# Patient Record
Sex: Female | Born: 1980 | Race: White | Hispanic: No | Marital: Single | Smoking: Current some day smoker
Health system: Southern US, Community
[De-identification: ages and names within clinical notes are randomized; demographics above are authoritative.]

## PROBLEM LIST (undated history)

## (undated) HISTORY — PX: TUBAL LIGATION: SHX77

---

## 2022-01-15 ENCOUNTER — Other Ambulatory Visit: Payer: Self-pay

## 2022-01-15 ENCOUNTER — Observation Stay: Payer: Self-pay

## 2022-01-15 ENCOUNTER — Observation Stay: Payer: Self-pay | Admitting: Anesthesiology

## 2022-01-15 ENCOUNTER — Encounter: Payer: Self-pay | Admitting: Orthopedic Surgery

## 2022-01-15 ENCOUNTER — Observation Stay
Admission: EM | Admit: 2022-01-15 | Discharge: 2022-01-16 | Disposition: A | Discharge status: Home / Self Care | Payer: Self-pay | Attending: Orthopedic Surgery | Admitting: Orthopedic Surgery

## 2022-01-15 ENCOUNTER — Emergency Department: Payer: Self-pay

## 2022-01-15 ENCOUNTER — Encounter
Admission: EM | Disposition: A | Discharge status: Home / Self Care | Payer: Self-pay | Source: Home / Self Care | Attending: Emergency Medicine

## 2022-01-15 DIAGNOSIS — S8261XA Displaced fracture of lateral malleolus of right fibula, initial encounter for closed fracture: Principal | ICD-10-CM | POA: Insufficient documentation

## 2022-01-15 DIAGNOSIS — Z20822 Contact with and (suspected) exposure to covid-19: Secondary | ICD-10-CM | POA: Insufficient documentation

## 2022-01-15 DIAGNOSIS — S8251XA Displaced fracture of medial malleolus of right tibia, initial encounter for closed fracture: Secondary | ICD-10-CM | POA: Insufficient documentation

## 2022-01-15 DIAGNOSIS — X501XXA Overexertion from prolonged static or awkward postures, initial encounter: Secondary | ICD-10-CM | POA: Insufficient documentation

## 2022-01-15 DIAGNOSIS — T1490XA Injury, unspecified, initial encounter: Secondary | ICD-10-CM

## 2022-01-15 DIAGNOSIS — Z419 Encounter for procedure for purposes other than remedying health state, unspecified: Secondary | ICD-10-CM

## 2022-01-15 DIAGNOSIS — S82891A Other fracture of right lower leg, initial encounter for closed fracture: Secondary | ICD-10-CM | POA: Diagnosis present

## 2022-01-15 DIAGNOSIS — S82841A Displaced bimalleolar fracture of right lower leg, initial encounter for closed fracture: Secondary | ICD-10-CM

## 2022-01-15 LAB — SURGICAL PCR SCREEN
MRSA, PCR: NEGATIVE
Staphylococcus aureus: NEGATIVE

## 2022-01-15 LAB — RESP PANEL BY RT-PCR (FLU A&B, COVID) ARPGX2
Influenza A by PCR: NEGATIVE
Influenza B by PCR: NEGATIVE
SARS Coronavirus 2 by RT PCR: NEGATIVE

## 2022-01-15 LAB — ETHANOL: Alcohol, Ethyl (B): 170 mg/dL — ABNORMAL HIGH (ref <10)

## 2022-01-15 LAB — HIV ANTIBODY (ROUTINE TESTING W REFLEX): HIV Screen 4th Generation wRfx: NONREACTIVE

## 2022-01-15 SURGERY — OPEN REDUCTION INTERNAL FIXATION (ORIF) ANKLE FRACTURE
Anesthesia: Regional | Site: Ankle | Laterality: Right

## 2022-01-15 MED ORDER — KETOROLAC TROMETHAMINE 15 MG/ML IJ SOLN: 15.0000 mg | Freq: Four times a day (QID) | INTRAMUSCULAR | Status: DC

## 2022-01-15 MED ORDER — BISACODYL 10 MG RE SUPP: 10.0000 mg | Freq: Every day | RECTAL | Status: DC | PRN

## 2022-01-15 MED ORDER — MIDAZOLAM HCL 2 MG/2ML IJ SOLN: 2.0000 mg | Freq: Once | INTRAMUSCULAR | Status: AC

## 2022-01-15 MED ORDER — OXYCODONE HCL 5 MG/5ML PO SOLN: 5.0000 mg | Freq: Once | ORAL | Status: DC | PRN

## 2022-01-15 MED ORDER — MORPHINE SULFATE (PF) 4 MG/ML IV SOLN
4.0000 mg | Freq: Once | INTRAVENOUS | Status: AC
  Administered 2022-01-15: 4 mg via INTRAVENOUS
  Filled 2022-01-15: qty 1

## 2022-01-15 MED ORDER — FENTANYL CITRATE (PF) 100 MCG/2ML IJ SOLN
INTRAMUSCULAR | Status: DC | PRN
  Administered 2022-01-15 (×2): 50 ug via INTRAVENOUS

## 2022-01-15 MED ORDER — ACETAMINOPHEN 10 MG/ML IV SOLN
INTRAVENOUS | Status: AC
  Filled 2022-01-15: qty 100

## 2022-01-15 MED ORDER — METOCLOPRAMIDE HCL 5 MG/ML IJ SOLN: 5.0000 mg | Freq: Three times a day (TID) | INTRAMUSCULAR | Status: DC | PRN

## 2022-01-15 MED ORDER — DEXMEDETOMIDINE (PRECEDEX) IN NS 20 MCG/5ML (4 MCG/ML) IV SYRINGE
PREFILLED_SYRINGE | INTRAVENOUS | Status: DC | PRN
  Administered 2022-01-15: 12 ug via INTRAVENOUS

## 2022-01-15 MED ORDER — ONDANSETRON HCL 4 MG PO TABS: 4.0000 mg | ORAL_TABLET | Freq: Four times a day (QID) | ORAL | Status: DC | PRN

## 2022-01-15 MED ORDER — BUPIVACAINE HCL (PF) 0.5 % IJ SOLN
INTRAMUSCULAR | Status: DC | PRN
  Administered 2022-01-15: 30 mL

## 2022-01-15 MED ORDER — HYDROMORPHONE HCL 1 MG/ML IJ SOLN: 0.5000 mg | INTRAMUSCULAR | Status: DC | PRN

## 2022-01-15 MED ORDER — LACTATED RINGERS IV SOLN: INTRAVENOUS | Status: DC

## 2022-01-15 MED ORDER — DEXAMETHASONE SODIUM PHOSPHATE 10 MG/ML IJ SOLN
INTRAMUSCULAR | Status: DC | PRN
Start: 2022-01-15 — End: 2022-01-15
  Administered 2022-01-15: 8 mg via INTRAVENOUS

## 2022-01-15 MED ORDER — CHLORHEXIDINE GLUCONATE 0.12 % MT SOLN
OROMUCOSAL | Status: AC
  Filled 2022-01-15: qty 15

## 2022-01-15 MED ORDER — CEFAZOLIN SODIUM-DEXTROSE 2-4 GM/100ML-% IV SOLN
INTRAVENOUS | Status: AC
  Filled 2022-01-15: qty 100

## 2022-01-15 MED ORDER — BUPIVACAINE HCL (PF) 0.5 % IJ SOLN
INTRAMUSCULAR | Status: AC
  Filled 2022-01-15: qty 30

## 2022-01-15 MED ORDER — MIDAZOLAM HCL 2 MG/2ML IJ SOLN
INTRAMUSCULAR | Status: AC
Start: 2022-01-15 — End: 2022-01-15
  Administered 2022-01-15: 2 mg via INTRAVENOUS
  Filled 2022-01-15: qty 2

## 2022-01-15 MED ORDER — OXYCODONE HCL 5 MG PO TABS
10.0000 mg | ORAL_TABLET | ORAL | Status: DC | PRN
  Administered 2022-01-16 (×2): 15 mg via ORAL

## 2022-01-15 MED ORDER — LACTATED RINGERS IV SOLN: INTRAVENOUS | Status: DC | PRN

## 2022-01-15 MED ORDER — ONDANSETRON HCL 4 MG/2ML IJ SOLN
4.0000 mg | Freq: Once | INTRAMUSCULAR | Status: AC
  Administered 2022-01-15: 4 mg via INTRAVENOUS
  Filled 2022-01-15: qty 2

## 2022-01-15 MED ORDER — MIDAZOLAM HCL 2 MG/2ML IJ SOLN
INTRAMUSCULAR | Status: AC
  Filled 2022-01-15: qty 2

## 2022-01-15 MED ORDER — FENTANYL CITRATE (PF) 100 MCG/2ML IJ SOLN: 25.0000 ug | INTRAMUSCULAR | Status: DC | PRN

## 2022-01-15 MED ORDER — ONDANSETRON HCL 4 MG/2ML IJ SOLN
INTRAMUSCULAR | Status: DC | PRN
  Administered 2022-01-15: 4 mg via INTRAVENOUS

## 2022-01-15 MED ORDER — PRONTOSAN WOUND IRRIGATION OPTIME
TOPICAL | Status: DC | PRN
  Administered 2022-01-15: 1

## 2022-01-15 MED ORDER — OXYCODONE HCL 5 MG PO TABS: 5.0000 mg | ORAL_TABLET | ORAL | Status: DC | PRN

## 2022-01-15 MED ORDER — PROPOFOL 10 MG/ML IV BOLUS
INTRAVENOUS | Status: DC | PRN
  Administered 2022-01-15: 200 mg via INTRAVENOUS

## 2022-01-15 MED ORDER — ACETAMINOPHEN 10 MG/ML IV SOLN: 1000.0000 mg | Freq: Once | INTRAVENOUS | Status: DC | PRN

## 2022-01-15 MED ORDER — METHOCARBAMOL 500 MG PO TABS
500.0000 mg | ORAL_TABLET | Freq: Four times a day (QID) | ORAL | Status: DC | PRN
  Administered 2022-01-16: 500 mg via ORAL
  Filled 2022-01-15: qty 1

## 2022-01-15 MED ORDER — DIPHENHYDRAMINE HCL 12.5 MG/5ML PO ELIX: 12.5000 mg | ORAL_SOLUTION | ORAL | Status: DC | PRN

## 2022-01-15 MED ORDER — ACETAMINOPHEN 325 MG PO TABS: 325.0000 mg | ORAL_TABLET | Freq: Four times a day (QID) | ORAL | Status: DC | PRN

## 2022-01-15 MED ORDER — ONDANSETRON HCL 4 MG/2ML IJ SOLN: 4.0000 mg | Freq: Four times a day (QID) | INTRAMUSCULAR | Status: DC | PRN

## 2022-01-15 MED ORDER — FENTANYL CITRATE (PF) 100 MCG/2ML IJ SOLN
INTRAMUSCULAR | Status: AC
  Filled 2022-01-15: qty 2

## 2022-01-15 MED ORDER — OXYCODONE HCL 5 MG PO TABS: 5.0000 mg | ORAL_TABLET | Freq: Once | ORAL | Status: DC | PRN

## 2022-01-15 MED ORDER — ZOLPIDEM TARTRATE 5 MG PO TABS
5.0000 mg | ORAL_TABLET | Freq: Every evening | ORAL | Status: DC | PRN
Start: 2022-01-15 — End: 2022-01-16

## 2022-01-15 MED ORDER — EPHEDRINE SULFATE (PRESSORS) 50 MG/ML IJ SOLN
INTRAMUSCULAR | Status: DC | PRN
  Administered 2022-01-15: 10 mg via INTRAVENOUS

## 2022-01-15 MED ORDER — MIDAZOLAM HCL 2 MG/2ML IJ SOLN
INTRAMUSCULAR | Status: DC | PRN
  Administered 2022-01-15: 2 mg via INTRAVENOUS

## 2022-01-15 MED ORDER — FENTANYL CITRATE PF 50 MCG/ML IJ SOSY: 100.0000 ug | PREFILLED_SYRINGE | Freq: Once | INTRAMUSCULAR | Status: AC

## 2022-01-15 MED ORDER — BUPIVACAINE-EPINEPHRINE (PF) 0.25% -1:200000 IJ SOLN
INTRAMUSCULAR | Status: AC
  Filled 2022-01-15: qty 30

## 2022-01-15 MED ORDER — CEFAZOLIN SODIUM-DEXTROSE 2-4 GM/100ML-% IV SOLN
2.0000 g | INTRAVENOUS | Status: AC
  Administered 2022-01-15: 2 g via INTRAVENOUS
  Filled 2022-01-15: qty 100

## 2022-01-15 MED ORDER — GLYCOPYRROLATE 0.2 MG/ML IJ SOLN
INTRAMUSCULAR | Status: DC | PRN
  Administered 2022-01-15: .2 mg via INTRAVENOUS

## 2022-01-15 MED ORDER — MUPIROCIN 2 % EX OINT
1.0000 "application " | TOPICAL_OINTMENT | Freq: Two times a day (BID) | CUTANEOUS | Status: DC
  Filled 2022-01-15: qty 22

## 2022-01-15 MED ORDER — FENTANYL CITRATE PF 50 MCG/ML IJ SOSY
PREFILLED_SYRINGE | INTRAMUSCULAR | Status: AC
  Administered 2022-01-15: 50 ug via INTRAVENOUS
  Filled 2022-01-15: qty 2

## 2022-01-15 MED ORDER — DOCUSATE SODIUM 100 MG PO CAPS: 100.0000 mg | ORAL_CAPSULE | Freq: Two times a day (BID) | ORAL | Status: DC

## 2022-01-15 MED ORDER — METHOCARBAMOL 1000 MG/10ML IJ SOLN
500.0000 mg | Freq: Four times a day (QID) | INTRAVENOUS | Status: DC | PRN
  Filled 2022-01-15: qty 5

## 2022-01-15 MED ORDER — MAGNESIUM HYDROXIDE 400 MG/5ML PO SUSP
30.0000 mL | Freq: Every day | ORAL | Status: DC | PRN
  Administered 2022-01-16: 30 mL via ORAL
  Filled 2022-01-15: qty 30

## 2022-01-15 MED ORDER — ACETAMINOPHEN 10 MG/ML IV SOLN
INTRAVENOUS | Status: DC | PRN
  Administered 2022-01-15: 1000 mg via INTRAVENOUS

## 2022-01-15 MED ORDER — DOCUSATE SODIUM 100 MG PO CAPS
100.0000 mg | ORAL_CAPSULE | Freq: Two times a day (BID) | ORAL | Status: DC
  Administered 2022-01-15 (×2): 100 mg via ORAL
  Filled 2022-01-15 (×2): qty 1

## 2022-01-15 MED ORDER — FENTANYL CITRATE PF 50 MCG/ML IJ SOSY
50.0000 ug | PREFILLED_SYRINGE | INTRAMUSCULAR | Status: DC | PRN
  Administered 2022-01-15: 50 ug via INTRAVENOUS
  Filled 2022-01-15: qty 1

## 2022-01-15 MED ORDER — OXYCODONE HCL 5 MG PO TABS
10.0000 mg | ORAL_TABLET | ORAL | Status: DC | PRN
  Administered 2022-01-15: 15 mg via ORAL
  Filled 2022-01-15 (×3): qty 3

## 2022-01-15 MED ORDER — ONDANSETRON HCL 4 MG/2ML IJ SOLN: 4.0000 mg | Freq: Once | INTRAMUSCULAR | Status: DC | PRN

## 2022-01-15 MED ORDER — LIDOCAINE HCL (CARDIAC) PF 100 MG/5ML IV SOSY
PREFILLED_SYRINGE | INTRAVENOUS | Status: DC | PRN
  Administered 2022-01-15: 100 mg via INTRAVENOUS

## 2022-01-15 MED ORDER — CEFAZOLIN SODIUM-DEXTROSE 2-4 GM/100ML-% IV SOLN
2.0000 g | Freq: Four times a day (QID) | INTRAVENOUS | Status: AC
  Administered 2022-01-15 – 2022-01-16 (×3): 2 g via INTRAVENOUS
  Filled 2022-01-15 (×3): qty 100

## 2022-01-15 MED ORDER — METOCLOPRAMIDE HCL 10 MG PO TABS: 5.0000 mg | ORAL_TABLET | Freq: Three times a day (TID) | ORAL | Status: DC | PRN

## 2022-01-15 MED ORDER — KETOROLAC TROMETHAMINE 30 MG/ML IJ SOLN
15.0000 mg | Freq: Four times a day (QID) | INTRAMUSCULAR | Status: AC
  Administered 2022-01-15 – 2022-01-16 (×4): 15 mg via INTRAVENOUS
  Filled 2022-01-15 (×4): qty 1

## 2022-01-15 SURGICAL SUPPLY — 60 items
BIT DRILL 2.5 X LONG (BIT) ×1
BIT DRILL CANN 2.7X625 NONSTRL (BIT) ×1 IMPLANT
BIT DRILL LCP QC 2X140 (BIT) ×1 IMPLANT
BIT DRILL QC 2.5X135 (BIT) ×1 IMPLANT
BIT DRILL QC 3.5 150 (BIT) ×1 IMPLANT
BIT DRILL X LONG 2.5 (BIT) IMPLANT
BNDG COHESIVE 6X5 TAN ST LF (GAUZE/BANDAGES/DRESSINGS) ×2 IMPLANT
BNDG ELASTIC 6X5.8 VLCR STR LF (GAUZE/BANDAGES/DRESSINGS) ×2 IMPLANT
BNDG ESMARK 6X12 TAN STRL LF (GAUZE/BANDAGES/DRESSINGS) ×2 IMPLANT
BRUSH SCRUB EZ  4% CHG (MISCELLANEOUS) ×2
BRUSH SCRUB EZ 4% CHG (MISCELLANEOUS) ×2 IMPLANT
CHLORAPREP W/TINT 26 (MISCELLANEOUS) ×4 IMPLANT
CUFF TOURN SGL QUICK 24 (TOURNIQUET CUFF)
CUFF TOURN SGL QUICK 34 (TOURNIQUET CUFF)
CUFF TRNQT CYL 24X4X16.5-23 (TOURNIQUET CUFF) IMPLANT
CUFF TRNQT CYL 34X4.125X (TOURNIQUET CUFF) IMPLANT
DRAPE 3/4 80X56 (DRAPES) ×2 IMPLANT
DRAPE FLUOR MINI C-ARM 54X84 (DRAPES) ×2 IMPLANT
DRILL BIT X LONG 2.5 (BIT) ×1
ELECT REM PT RETURN 9FT ADLT (ELECTROSURGICAL) ×2
ELECTRODE REM PT RTRN 9FT ADLT (ELECTROSURGICAL) ×1 IMPLANT
GAUZE XEROFORM 1X8 LF (GAUZE/BANDAGES/DRESSINGS) ×4 IMPLANT
GLOVE SURG ORTHO LTX SZ8.5 (GLOVE) ×6 IMPLANT
GLOVE SURG UNDER POLY LF SZ8.5 (GLOVE) ×6 IMPLANT
GOWN STRL REUS W/ TWL LRG LVL3 (GOWN DISPOSABLE) ×1 IMPLANT
GOWN STRL REUS W/ TWL XL LVL3 (GOWN DISPOSABLE) ×1 IMPLANT
GOWN STRL REUS W/TWL LRG LVL3 (GOWN DISPOSABLE) ×1
GOWN STRL REUS W/TWL XL LVL3 (GOWN DISPOSABLE) ×1
GUIDEWARE NON THREAD 1.25X150 (WIRE) ×4
GUIDEWIRE NON THREAD 1.25X150 (WIRE) IMPLANT
KIT TURNOVER KIT A (KITS) ×2 IMPLANT
MANIFOLD NEPTUNE II (INSTRUMENTS) ×2 IMPLANT
NS IRRIG 1000ML POUR BTL (IV SOLUTION) ×2 IMPLANT
PACK EXTREMITY ARMC (MISCELLANEOUS) ×2 IMPLANT
PAD ABD DERMACEA PRESS 5X9 (GAUZE/BANDAGES/DRESSINGS) ×8 IMPLANT
PAD CAST CTTN 4X4 STRL (SOFTGOODS) ×2 IMPLANT
PADDING CAST COTTON 4X4 STRL (SOFTGOODS) ×2
PLATE FIBULA 5 HOLE RIGHT (Plate) ×1 IMPLANT
SCALPEL PROTECTED #10 DISP (BLADE) ×2 IMPLANT
SCALPEL PROTECTED #15 DISP (BLADE) ×2 IMPLANT
SCREW CANN L THRD/46 4.0 (Screw) ×2 IMPLANT
SCREW CORT ST T8 SD REC 2.7X20 (Screw) IMPLANT
SCREW CORTEX LOW PRO 3.5X28 (Screw) IMPLANT
SCREW CORTEX PROFILE 3.5X40 (Screw) ×1 IMPLANT
SCREW CORTICAL 2.7X24MM (Screw) ×1 IMPLANT
SCREW LOCK VA ST 2.7X18 (Screw) ×3 IMPLANT
SCREW LOCK VA ST 2.7X20 (Screw) ×2 IMPLANT
SCREW SELF TAP 12M (Screw) ×1 IMPLANT
SCREW SELF TAP 14MM (Screw) ×2 IMPLANT
SOLUTION PRONTOSAN WOUND 350ML (IRRIGATION / IRRIGATOR) ×1 IMPLANT
SPLINT CAST 1 STEP 5X30 WHT (MISCELLANEOUS) ×2 IMPLANT
SPONGE T-LAP 18X18 ~~LOC~~+RFID (SPONGE) ×2 IMPLANT
STAPLER SKIN PROX 35W (STAPLE) ×3 IMPLANT
SUT VIC AB 2-0 CT2 27 (SUTURE) ×4 IMPLANT
SUT VIC AB 3-0 SH 27 (SUTURE) ×1
SUT VIC AB 3-0 SH 27X BRD (SUTURE) ×1 IMPLANT
TAPE SURG TRANSPORE 1 IN (GAUZE/BANDAGES/DRESSINGS) ×1 IMPLANT
TAPE SURGICAL TRANSPORE 1 IN (GAUZE/BANDAGES/DRESSINGS) ×1
TOWEL OR 17X26 4PK STRL BLUE (TOWEL DISPOSABLE) ×4 IMPLANT
WATER STERILE IRR 500ML POUR (IV SOLUTION) ×2 IMPLANT

## 2022-01-15 NOTE — Anesthesia Procedure Notes (Signed)
Procedure Name: LMA Insertion ?Date/Time: 01/15/2022 4:33 PM ?Performed by: Jaye Beagle, CRNA ?Pre-anesthesia Checklist: Patient identified, Emergency Drugs available, Suction available and Patient being monitored ?Patient Re-evaluated:Patient Re-evaluated prior to induction ?Oxygen Delivery Method: Circle system utilized ?Preoxygenation: Pre-oxygenation with 100% oxygen ?Induction Type: IV induction ?Ventilation: Mask ventilation without difficulty ?LMA: LMA inserted ?LMA Size: 4.0 ?Tube type: Oral ?Number of attempts: 1 ?Airway Equipment and Method: Stylet and Oral airway ?Placement Confirmation: positive ETCO2 and breath sounds checked- equal and bilateral ?Tube secured with: Tape ?Dental Injury: Teeth and Oropharynx as per pre-operative assessment  ? ? ? ? ?

## 2022-01-15 NOTE — Anesthesia Procedure Notes (Signed)
Anesthesia Regional Block: Popliteal block  ? ?Pre-Anesthetic Checklist: , timeout performed,  Correct Patient, Correct Site, Correct Laterality,  Correct Procedure,, risks and benefits discussed,  Surgical consent,  Pre-op evaluation,  At surgeon's request and post-op pain management ? ?Laterality: Right ? ?Prep: chloraprep     ?  ?Needles:  ?Injection technique: Single-shot ? ?Needle Type: Echogenic Needle   ? ? ? ? ? ? ? ?Additional Needles: ? ? ?Procedures:,,,, ultrasound used (permanent image in chart),,   ?Motor weakness within 20 minutes.  ?Narrative:  ?Start time: 01/15/2022 3:19 PM ?End time: 01/15/2022 3:25 PM ?Injection made incrementally with aspirations every 5 mL. ? ?Performed by: Personally  ?Anesthesiologist: Reed Breech, MD ? ?Additional Notes: ?Functioning IV was confirmed and monitors applied.  Sterile prep and drape, hand hygiene and sterile gloves were used. Ultrasound guidance: relevant anatomy identified, needle position confirmed, local anesthetic spread visualized around nerve(s), vascular puncture avoided.  Image saved to electronic medical record.  Negative aspiration prior to incremental administration of local anesthetic for total 20 ml bupivacaine 0.5% given in popliteal sciatic distribution. Difficult visualization of needle at depth of nerve.  The patient tolerated the procedure well. Vital signs and moderate sedation medications recorded in RN notes.  ? ? ? ?

## 2022-01-15 NOTE — ED Notes (Signed)
Ortho/splint supplies in room.  ?

## 2022-01-15 NOTE — ED Provider Notes (Signed)
? ?[facility] ?Provider Note ? ? ? Event Date/Time  ? First MD Initiated Contact with Patient 01/15/22 [phone]   ?  (approximate) ? ? ?History  ? ?Ankle Injury ? ? ?HPI ? ?Joyce Conley is a 41 y.o. female who presents for evaluation of right ankle pain.  Patient reports that she was cleaning her house and she twisted her ankle.  Has been unable to bear any weight.  This happened 2 hours prior to arrival.  Patient denies any other injuries.  She is complaining of constant throbbing sharp pain that is worse with weightbearing.  No knee pain or hip pain.  No head trauma or LOC.  She does not take any blood thinners.  Patient has had prior trimalleolar fracture of this ankle which was repaired surgically in 2011. ?  ? ?PMH ?None  ? ?Physical Exam  ? ?Triage Vital Signs: ?ED Triage Vitals [01/15/22 0307]  ?Enc Vitals Group  ?   BP 107/79  ?   Pulse Rate 89  ?   Resp 16  ?   Temp 97.9 ?F (36.6 ?C)  ?   Temp Source Oral  ?   SpO2 97 %  ?   Weight 240 lb (108.9 kg)  ?   Height 5\' 8"  (1.727 m)  ?   Head Circumference   ?   Peak Flow   ?   Pain Score 8  ?   Pain Loc   ?   Pain Edu?   ?   Excl. in [place]?   ? ? ?Most recent vital signs: ?Vitals:  ? 01/15/22 0307 01/15/22 0413  ?BP: 107/79 125/72  ?Pulse: 89 93  ?Resp: 16 16  ?Temp: 97.9 ?F (36.6 ?C)   ?SpO2: 97% 97%  ? ? ? ?Constitutional: Alert and oriented. Well appearing and in no apparent distress. ?HEENT: ?     Head: Normocephalic and atraumatic.    ?     Eyes: Conjunctivae are normal. Sclera is non-icteric.  ?     Mouth/Throat: Mucous membranes are moist.  ?     Neck: Supple with no signs of meningismus. ?Cardiovascular: Regular rate and rhythm. No murmurs, gallops, or rubs. 2+ symmetrical distal pulses are present in all extremities.  ?Respiratory: Normal respiratory effort. Lungs are clear to auscultation bilaterally.  ?Gastrointestinal: Soft, non tender, and non distended with positive bowel sounds. No rebound or guarding. ?Genitourinary: No CVA  tenderness. ?Musculoskeletal: Swelling and tenderness of bilateral malleoli on the right with intact distal pulses and brisk capillary refill ?Neurologic: Normal speech and language. Face is symmetric. Moving all extremities. No gross focal neurologic deficits are appreciated. ?Skin: Skin is warm, dry and intact. No rash noted. ?Psychiatric: Mood and affect are normal. Speech and behavior are normal. ? ?ED Results / Procedures / Treatments  ? ?Labs ?(all labs ordered are listed, but only abnormal results are displayed) ?Labs Reviewed  ?RESP PANEL BY RT-PCR (FLU A&B, COVID) [id number]  ?ETHANOL  ? ? ? ?EKG ? ?none ? ? ?RADIOLOGY ?I, Rudene Re, attending MD, have personally viewed and interpreted the images obtained during this visit as below: ? ?X-ray showing dislocation of the ankle with bimalleolar fracture ? ? ?___________________________________________________ ?Interpretation by Radiologist:  ?DG Ankle Complete Right ? ?Result Date: 01/15/2022 ?CLINICAL DATA:  Severe pain and swelling after injury. EXAM: RIGHT ANKLE - COMPLETE 3+ VIEW COMPARISON:  None. FINDINGS: Transverse fracture of the medial malleolus with distraction and lateral displacement of the distal fracture fragment. Small comminuted  fragments are demonstrated. Oblique fracture of the distal fibula with full shaft with lateral displacement, 2.8 cm overriding, and mild posterior displacement of the distal fracture fragments. 2 cm lateral displacement of the talus with respect to the tibia. Diffuse soft tissue swelling. Degenerative changes in the intertarsal joints. IMPRESSION: Bimalleolar fractures of the right ankle with lateral displacement. Electronically Signed   By: Lucienne Capers M.D.   On: 01/15/2022 03:33   ? ? ? ?PROCEDURES: ? ?Critical Care performed: No ? ?Procedures ? ? ? ?IMPRESSION / MDM / ASSESSMENT AND PLAN / ED COURSE  ?I reviewed the triage vital signs and the nursing notes. ? ?41 y.o. female who presents for evaluation of  right ankle pain and swelling after twisting her ankle.  Prior trimalleolar fracture repaired surgically in 2011 of the same ankle.  Patient has intact neurovascular exam but pretty significant swelling of the right ankle.  Skin is intact.  She has diffuse tenderness and reduced range of motion.  X-ray shows bimalleolar fracture with displacement of the ankle joint.  Patient was given IV morphine and attempt of reduction was unsuccessful.  There is a large piece of the medial malleolus in the joint to make it impossible for me to reduce the dislocation.  Patient remained neurovascularly intact after this attempt.  She was placed on a short leg with a steroid up splint.  I consulted Ortho and spoke with Dr. Harlow Mares recommended admission for surgical repair this morning.  Patient will stay n.p.o.  COVID swab has been done. ? ? ?MEDICATIONS GIVEN IN ED: ?Medications  ?morphine (PF) 4 MG/ML injection 4 mg (4 mg Intravenous Given 01/15/22 0423)  ?ondansetron [facility]) injection 4 mg (4 mg Intravenous Given 01/15/22 0423)  ? ? ?Consults: Orthopedics ? ? ?EMR reviewed none available ? ? ? ?FINAL CLINICAL IMPRESSION(S) / ED DIAGNOSES  ? ?Final diagnoses:  ?Injury  ?Closed displaced bimalleolar fracture of right ankle, initial encounter  ? ? ? ?Rx / DC Orders  ? ?ED Discharge Orders   ? ? None  ? ?  ? ? ? ?Note:  This document was prepared using Dragon voice recognition software and may include unintentional dictation errors. ? ? ?Please note:  Patient was evaluated in Emergency Department today for the symptoms described in the history of present illness. Patient was evaluated in the context of the global COVID-19 pandemic, which necessitated consideration that the patient might be at risk for infection with the SARS-CoV-2 virus that causes COVID-19. Institutional protocols and algorithms that pertain to the evaluation of patients at risk for COVID-19 are in a state of rapid change based on information released by regulatory  bodies including the CDC and federal and state organizations. These policies and algorithms were followed during the patient's care in the ED.  Some ED evaluations and interventions may be delayed as a result of limited staffing during the pandemic. ? ? ? ? ?  ?Rudene Re, MD ?01/15/22 [id number] ? ?

## 2022-01-15 NOTE — Anesthesia Procedure Notes (Signed)
Anesthesia Regional Block: Adductor canal block  ? ?Pre-Anesthetic Checklist: , timeout performed,  Correct Patient, Correct Site, Correct Laterality,  Correct Procedure,, risks and benefits discussed,  Surgical consent,  Pre-op evaluation,  At surgeon's request and post-op pain management ? ?Laterality: Right ? ?Prep: chloraprep     ?  ?Needles:  ?Injection technique: Single-shot ? ?Needle Type: Echogenic Needle   ? ? ? ? ? ? ? ?Additional Needles: ? ? ?Procedures:,,,, ultrasound used (permanent image in chart),,   ?Motor weakness within 20 minutes.  ?Narrative:  ?Start time: 01/15/2022 3:27 PM ?End time: 01/15/2022 3:28 PM ?Injection made incrementally with aspirations every 5 mL. ? ?Performed by: Personally  ?Anesthesiologist: Reed Breech, MD ? ?Additional Notes: ?Functioning IV was confirmed and monitors applied.  Sterile prep and drape, hand hygiene and sterile gloves were used. Ultrasound guidance: relevant anatomy identified, needle position confirmed, local anesthetic spread visualized around nerve(s), vascular puncture avoided.  Image saved to electronic medical record.  Negative aspiration prior to incremental administration of local anesthetic for total 10 ml bupivacaine 0.5% given in adductor saphenous distribution. The patient tolerated the procedure well. Vital signs and moderate sedation medications recorded in RN notes.  ? ? ? ?

## 2022-01-15 NOTE — ED Notes (Signed)
SL splint with sugar tong placed on R ankle ?

## 2022-01-15 NOTE — ED Triage Notes (Signed)
Pt states she twisted her right ankle about two hours pta. Pt with deformity noted, cms intact to toes.  ?

## 2022-01-15 NOTE — Op Note (Signed)
?  01/15/2022 ? ?7:06 PM ? ?PATIENT:  Joyce Conley  41 y.o. female ? ?PRE-OPERATIVE DIAGNOSIS:  Right Ankle Fracture ? ?POST-OPERATIVE DIAGNOSIS:  Same ? ?PROCEDURE:  RIGHT OPEN REDUCTION INTERNAL FIXATION (ORIF) ANKLE FRACTURE, MEDIAL and LATERAL and SYNDESMOSIS, DEBRIDEMENT OF TIBIOTALAR JOINT ? ?SURGEON:  Cassell Smiles, MD ? ?ASST:  Altamese Cabal, PA-C ? ?ANESTHESIA:   General ? ?EBL:  50 ? ?TOURNIQUET TIME:  73 min @ 250 mmHg ? ?OPERATIVE FINDINGS:  Unstable, comminuted fracture of the lateral malleolus, comminuted, displaced medial malleolus, unstable syndesmosis, loose bodies and fracture fragments within the tibiotalar joint, malunion of prior trimalleolar ankle fracture with significant lateral bony callous ? ?OPERATIVE PROCEDURE:   The patient was brought to the operating room and placed in the supine position. All bony prominences were padded. General anesthesia was administered. The lower extremity was prepped and draped in the usual sterile fashion. The leg was elevated and exsanguinated and the tourniquet was inflated. Time out was performed.  ? ?The prior incision was made over the distal fibula and the fracture was exposed and reduced anatomically with a clamp.  I then applied a synthes lateral locking plate and secured it proximally with cortical screws and distally with locking screws.  I used the Fluoroscan to confirm satisfactory reduction and fixation.  ? ?I then turned my attention to the medial malleolus. The prior incision was made over the medial malleolus. The joint was exposed and bony fragments, cartilage fragments and debris was removed. The joint was irrigated. The fracture was exposed and held provisionally with a clamp. 2 guidepins were placed for the 4.0 mm cannulated screws and then confirmation of reduction was made with fluoroscopy. I then placed 2  30mm screws which had satisfactory fixation. Fluoroscopy showed good reduction and hardware placement.  ? ?The syndesmosis was  stressed using live fluoroscopy and found to be unstable. A syndesmotic screw was placed with excellent fixation. ? ?The medial and lateral wound was irrigated, and closed with vicryl and staples. Sponge and needle counts were correct. Sterile gauze was applied followed by a posterior splint. She was awakened and returned to the PACU in stable and satisfactory condition. There were no apparent complications. ? ?Cassell Smiles, MD ? ?

## 2022-01-15 NOTE — Anesthesia Preprocedure Evaluation (Addendum)
Anesthesia Evaluation  ?Patient identified by MRN, date of birth, ID band ?Patient awake ? ? ? ?Reviewed: ?Allergy & Precautions, NPO status , Patient's Chart, lab work & pertinent test results ? ?History of Anesthesia Complications ?Negative for: history of anesthetic complications ? ?Airway ?Mallampati: III ? ? ?Neck ROM: Full ? ? ? Dental ?no notable dental hx. ? ?  ?Pulmonary ?Current Smoker and Patient abstained from smoking.,  ?  ?Pulmonary exam normal ?breath sounds clear to auscultation ? ? ? ? ? ? Cardiovascular ?Exercise Tolerance: Good ?negative cardio ROS ?Normal cardiovascular exam ?Rhythm:Regular Rate:Normal ? ? ?  ?Neuro/Psych ?negative neurological ROS ?   ? GI/Hepatic ?negative GI ROS,   ?Endo/Other  ?Obesity  ? Renal/GU ?negative Renal ROS  ? ?  ?Musculoskeletal ? ? Abdominal ?  ?Peds ? Hematology ?negative hematology ROS ?(+)   ?Anesthesia Other Findings ? ? Reproductive/Obstetrics ? ?  ? ? ? ? ? ? ? ? ? ? ? ? ? ?  ?  ? ? ? ? ? ? ? ?Anesthesia Physical ?Anesthesia Plan ? ?ASA: 2 ? ?Anesthesia Plan: General and Regional  ? ?Post-op Pain Management: Regional block*  ? ?Induction: Intravenous ? ?PONV Risk Score and Plan: 3 and Ondansetron, Dexamethasone and Treatment may vary due to age or medical condition ? ?Airway Management Planned: LMA ? ?Additional Equipment:  ? ?Intra-op Plan:  ? ?Post-operative Plan: Extubation in OR ? ?Informed Consent: I have reviewed the patients History and Physical, chart, labs and discussed the procedure including the risks, benefits and alternatives for the proposed anesthesia with the patient or authorized representative who has indicated his/her understanding and acceptance.  ? ? ? ?Dental advisory given ? ?Plan Discussed with: CRNA ? ?Anesthesia Plan Comments: (Plan for preoperative adductor saphenous and popliteal sciatic nerve blocks and GA with LMA, GETA backup.  Patient consented for risks of anesthesia including but not limited  to:  ?- adverse reactions to medications ?- damage to eyes, teeth, lips or other oral mucosa ?- nerve damage due to positioning  ?- sore throat or hoarseness ?- damage to heart, brain, nerves, lungs, other parts of body or loss of life ? ?Informed patient about role of CRNA in peri- and intra-operative care.  Patient voiced understanding.)  ? ? ? ? ? ?Anesthesia Quick Evaluation ? ?

## 2022-01-15 NOTE — Progress Notes (Signed)
Admission profile updated. ?

## 2022-01-15 NOTE — Transfer of Care (Signed)
Immediate Anesthesia Transfer of Care Note ? ?Patient: Joyce Conley ? ?Procedure(s) Performed: OPEN REDUCTION INTERNAL FIXATION (ORIF) ANKLE FRACTURE (Right: Ankle) ? ?Patient Location: PACU ? ?Anesthesia Type:General ? ?Level of Consciousness: drowsy ? ?Airway & Oxygen Therapy: Patient Spontanous Breathing and Patient connected to face mask oxygen ? ?Post-op Assessment: Report given to RN ? ?Post vital signs: stable ? ?Last Vitals:  ?Vitals Value Taken Time  ?BP    ?Temp    ?Pulse    ?Resp    ?SpO2    ? ? ?Last Pain:  ?Vitals:  ? 01/15/22 1443  ?TempSrc: Oral  ?PainSc: 8   ?   ? ?Patients Stated Pain Goal: 0 (01/15/22 1443) ? ?Complications: No notable events documented. ?

## 2022-01-15 NOTE — H&P (Signed)
PREOPERATIVE H&P ? ?Chief Complaint: right ankle pain after fall ? ?HPI: ?Joyce Conley is a 41 y.o. female who presents for preoperative history and physical with a diagnosis of right ankle fracture. Symptoms are rated as moderate to severe, and have been worsening.  This is significantly impairing activities of daily living.  She has elected for surgical management.  ? ?No past medical history on file. ? ?Social History  ? ?Socioeconomic History  ? Marital status: Single  ?  Spouse name: Not on file  ? Number of children: Not on file  ? Years of education: Not on file  ? Highest education level: Not on file  ?Occupational History  ? Not on file  ?Tobacco Use  ? Smoking status: Not on file  ? Smokeless tobacco: Not on file  ?Substance and Sexual Activity  ? Alcohol use: Not on file  ? Drug use: Not on file  ? Sexual activity: Not on file  ?Other Topics Concern  ? Not on file  ?Social History Narrative  ? Not on file  ? ?Social Determinants of Health  ? ?Financial Resource Strain: Not on file  ?Food Insecurity: Not on file  ?Transportation Needs: Not on file  ?Physical Activity: Not on file  ?Stress: Not on file  ?Social Connections: Not on file  ? ?No family history on file. ?No Known Allergies ?Prior to Admission medications   ?Not on File  ? ? ? ?Positive ROS: All other systems have been reviewed and were otherwise negative with the exception of those mentioned in the HPI and as above. ? ?Physical Exam: ?General: Alert, no acute distress ?Cardiovascular: Regular rate and rhythm, no murmurs rubs or gallops.  No pedal edema ?Respiratory: Clear to auscultation bilaterally, no wheezes rales or rhonchi. No cyanosis, no use of accessory musculature ?GI: No organomegaly, abdomen is soft and non-tender nondistended with positive bowel sounds. ?Skin: Skin intact, no lesions within the operative field. ?Neurologic: Sensation intact distally ?Psychiatric: Patient is competent for consent with normal mood and  affect ?Lymphatic: No axillary or cervical lymphadenopathy ? ?MUSCULOSKELETAL: right lower extremity splint c/d/I, good cap refill ? ?Assessment: ?Displaced bimalleolar right ankle fracture ? ?Plan: ?Plan for right ankle open reduction and internal fixation ? ?I discussed the risks and benefits of surgery. The risks include but are not limited to infection, bleeding requiring blood transfusion, nerve or blood vessel injury, joint stiffness or loss of motion, persistent pain, weakness or instability, malunion, nonunion and hardware failure and the need for further surgery. Medical risks include but are not limited to DVT and pulmonary embolism, myocardial infarction, stroke, pneumonia, respiratory failure and death. Patient understood these risks and wished to proceed.  ? ?Lyndle Herrlich, MD ? ? ?01/15/2022 ?7:45 AM ? ? ? ?

## 2022-01-15 NOTE — Plan of Care (Signed)

## 2022-01-16 ENCOUNTER — Other Ambulatory Visit: Payer: Self-pay

## 2022-01-16 ENCOUNTER — Encounter: Payer: Self-pay | Admitting: Orthopedic Surgery

## 2022-01-16 MED ORDER — METHOCARBAMOL 500 MG PO TABS
500.0000 mg | ORAL_TABLET | Freq: Four times a day (QID) | ORAL | 0 refills | Status: DC | PRN
  Filled 2022-01-16: qty 60, 15d supply, fill #0

## 2022-01-16 MED ORDER — DOCUSATE SODIUM 100 MG PO CAPS
100.0000 mg | ORAL_CAPSULE | Freq: Two times a day (BID) | ORAL | 0 refills | Status: DC
  Filled 2022-01-16: qty 30, 15d supply, fill #0

## 2022-01-16 MED ORDER — ASPIRIN EC 81 MG PO TBEC
81.0000 mg | DELAYED_RELEASE_TABLET | Freq: Every day | ORAL | 11 refills | Status: DC
  Filled 2022-01-16: qty 30, 30d supply, fill #0

## 2022-01-16 MED ORDER — OXYCODONE HCL 5 MG PO TABS
5.0000 mg | ORAL_TABLET | ORAL | 0 refills | Status: DC | PRN
  Filled 2022-01-16: qty 30, 5d supply, fill #0

## 2022-01-16 NOTE — Discharge Summary (Signed)
Physician Discharge Summary  ?Patient ID: ?Joyce Conley ?MRN: [id number] ?DOB/AGE: 1981-04-17 41 y.o. ? ?Admit date: 01/15/2022 ?Discharge date: 01/16/2022 ? ?Admission Diagnoses:  ?Right Ankle Fracture ?Closed right ankle fracture ? ?Discharge Diagnoses:  ?Right Ankle Fracture ?Principal Problem: ?  Closed right ankle fracture ? ? ?History reviewed. No pertinent past medical history. ? ?Surgeries: Procedure(s): ?OPEN REDUCTION INTERNAL FIXATION (ORIF) ANKLE FRACTURE on 01/15/2022 ?  ?Consultants (if any): Treatment Team:  ?Lovell Sheehan, MD ? ?Discharged Condition: Improved ? ?Hospital Course: Joyce Conley is an 41 y.o. female who was admitted 01/15/2022 with a diagnosis of  Right Ankle Fracture Closed right ankle fracture and went to the operating room on 01/15/2022 and underwent the above named procedures.   ? ?She was given perioperative antibiotics:  ?Anti-infectives (From admission, onward)  ? ? Start     Dose/Rate Route Frequency Ordered Stop  ? 01/15/22 2230  ceFAZolin (ANCEF) IVPB 2g/100 mL premix       ? 2 g ?200 mL/hr over 30 Minutes Intravenous Every 6 hours 01/15/22 1942 01/16/22 1023  ? 01/15/22 1600  ceFAZolin (ANCEF) 2-4 GM/100ML-% IVPB       ?Note to Pharmacy: Olena Mater F: cabinet override  ?    01/15/22 1600 01/15/22 1641  ? 01/15/22 0829  ceFAZolin (ANCEF) IVPB 2g/100 mL premix       ? 2 g ?200 mL/hr over 30 Minutes Intravenous 30 min pre-op 01/15/22 0829 01/15/22 1641  ? ?  ?. ? ?She was given sequential compression devices, early ambulation, and aspirin 81 mg daily X 30 days for DVT prophylaxis. ? ?She benefited maximally from the hospital stay and there were no complications.   ? ?Recent vital signs:  ?Vitals:  ? 01/16/22 0430 01/16/22 0810  ?BP: 119/63 (!) 148/79  ?Pulse: 69 66  ?Resp: 17 15  ?Temp: 97.7 ?F (36.5 ?C) 97.7 ?F (36.5 ?C)  ?SpO2: 96% 100%  ? ? ?Recent laboratory studies:  ?No results found for: HGB ?No results found for: WBC, PLT ?No results found for: INR ?No results  found for: NA, K, CL, CO2, BUN, CREATININE, GLUCOSE ? ?Discharge Medications:   ?Allergies as of 01/16/2022   ?No Known Allergies ?  ? ?  ?Medication List  ?  ? ?TAKE these medications   ? ?acetaminophen 325 MG tablet ?Commonly known as: TYLENOL ?Take 650 mg by mouth every 6 (six) hours as needed. ?  ?aspirin EC 81 MG tablet ?Take 1 tablet (81 mg total) by mouth daily. Swallow whole. ?  ?docusate sodium 100 MG capsule ?Commonly known as: COLACE ?Take 1 capsule (100 mg total) by mouth 2 (two) times daily. ?  ?ibuprofen 200 MG tablet ?Commonly known as: ADVIL ?Take 200 mg by mouth every 6 (six) hours as needed. ?  ?methocarbamol 500 MG tablet ?Commonly known as: ROBAXIN ?Take 1 tablet (500 mg total) by mouth every 6 (six) hours as needed for muscle spasms. ?  ?oxyCODONE 5 MG immediate release tablet ?Commonly known as: Oxy IR/ROXICODONE ?Take 1 tablet (5 mg total) by mouth every 4 (four) hours as needed for moderate pain. ?  ? ?  ? ? ?Diagnostic Studies: DG Ankle Complete Right ? ?Result Date: 01/15/2022 ?CLINICAL DATA:  Severe pain and swelling after injury. EXAM: RIGHT ANKLE - COMPLETE 3+ VIEW COMPARISON:  None. FINDINGS: Transverse fracture of the medial malleolus with distraction and lateral displacement of the distal fracture fragment. Small comminuted fragments are demonstrated. Oblique fracture of the distal fibula with full shaft with  lateral displacement, 2.8 cm overriding, and mild posterior displacement of the distal fracture fragments. 2 cm lateral displacement of the talus with respect to the tibia. Diffuse soft tissue swelling. Degenerative changes in the intertarsal joints. IMPRESSION: Bimalleolar fractures of the right ankle with lateral displacement. Electronically Signed   By: Lucienne Capers M.D.   On: 01/15/2022 03:33  ? ?[place] OR NERVE BLOCK-IMAGE ONLY [facility]) ? ?Result Date: 01/15/2022 ?There is no interpretation for this exam.  This order is for images obtained during a surgical procedure.  Please See  "Surgeries" Tab for more information regarding the procedure.  ? ?DG MINI C-ARM IMAGE ONLY ? ?Result Date: 01/15/2022 ?There is no interpretation for this exam.  This order is for images obtained during a surgical procedure.  Please See "Surgeries" Tab for more information regarding the procedure.   ? ?Disposition: Discharge disposition: 01-Home or Self Care ? ? ? ? ? ? ? ? ? ? ? ?Signed: ?Carlynn Spry ,PA-C ?01/16/2022, 1:41 PM ? ?  ? ?

## 2022-01-16 NOTE — Plan of Care (Signed)

## 2022-01-16 NOTE — Progress Notes (Signed)
?  Subjective: ? ?Patient reports pain as mild.   ? ?Objective:  ? ?VITALS:   ?Vitals:  ? 01/15/22 2215 01/16/22 0106 01/16/22 0430 01/16/22 0810  ?BP: 129/80 (!) 123/93 119/63 (!) 148/79  ?Pulse: 82 93 69 66  ?Resp: 17 16 17 15   ?Temp: 98.1 ?F (36.7 ?C)  97.7 ?F (36.5 ?C) 97.7 ?F (36.5 ?C)  ?TempSrc:      ?SpO2: 94% 96% 96% 100%  ?Weight:      ?Height:      ? ? ?PHYSICAL EXAM: ? ?Neurologically intact ?ABD soft ?Neurovascular intact ?Sensation intact distally ?Intact pulses distally ?Dorsiflexion/Plantar flexion intact ?Incision: dressing C/D/I ?No cellulitis present ?Compartment soft ? ?LABS ? ?Results for orders placed or performed during the hospital encounter of 01/15/22 (from the past 24 hour(s))  ?Surgical PCR screen     Status: None  ? Collection Time: 01/15/22  9:09 AM  ? Specimen: Nasal Mucosa; Nasal Swab  ?Result Value Ref Range  ? MRSA, PCR NEGATIVE NEGATIVE  ? Staphylococcus aureus NEGATIVE NEGATIVE  ?HIV Antibody (routine testing w rflx)     Status: None  ? Collection Time: 01/15/22 11:45 AM  ?Result Value Ref Range  ? HIV Screen 4th Generation wRfx Non Reactive Non Reactive  ? ? ?DG Ankle Complete Right ? ?Result Date: 01/15/2022 ?CLINICAL DATA:  Severe pain and swelling after injury. EXAM: RIGHT ANKLE - COMPLETE 3+ VIEW COMPARISON:  None. FINDINGS: Transverse fracture of the medial malleolus with distraction and lateral displacement of the distal fracture fragment. Small comminuted fragments are demonstrated. Oblique fracture of the distal fibula with full shaft with lateral displacement, 2.8 cm overriding, and mild posterior displacement of the distal fracture fragments. 2 cm lateral displacement of the talus with respect to the tibia. Diffuse soft tissue swelling. Degenerative changes in the intertarsal joints. IMPRESSION: Bimalleolar fractures of the right ankle with lateral displacement. Electronically Signed   By: 01/17/2022 M.D.   On: 01/15/2022 03:33  ? ?01/17/2022 OR NERVE BLOCK-IMAGE ONLY  [facility]) ? ?Result Date: 01/15/2022 ?There is no interpretation for this exam.  This order is for images obtained during a surgical procedure.  Please See "Surgeries" Tab for more information regarding the procedure.  ? ?DG MINI C-ARM IMAGE ONLY ? ?Result Date: 01/15/2022 ?There is no interpretation for this exam.  This order is for images obtained during a surgical procedure.  Please See "Surgeries" Tab for more information regarding the procedure.   ? ?Assessment/Plan: ?1 Day Post-Op  ? ?Principal Problem: ?  Closed right ankle fracture ? ? ?Advance diet ?Up with therapy ?Probable discharge home today ?F/U in 2 weeks for staple removal ?NWB RLE ? ? ?01/17/2022 , PA-C ?01/16/2022, 8:23 AM ? ? ? ? ?  ?

## 2022-01-16 NOTE — Evaluation (Signed)
Physical Therapy Evaluation ?Patient Details ?Name: Joyce Conley ?MRN: [id number] ?DOB: 02-Sep-1981 ?Today's Date: 01/16/2022 ? ?History of Present Illness ? 41 y/o female here after fall and R ankle fx and subsequent ORIF 3/13.  She had similar course of events in 2011 and apparently did not manage NWBing well at that time.  ?Clinical Impression ? Pt pleasant and eager to work with PT put ultimately showed impulsivity with most mobility and despite consistent reminders (and warnings) to insure NWBing she clearly put weight on R LE while trying to manage steps.  She lives on second floor of her aunt's town home, after today's performance I strongly recommended that she stay on the ground floor at least initially to insure she does not put unnecessary weight on the healing LE.  Pt reports that she used crutches 11 years ago in similar situation and was not good about NWBing then, I educated her that given her impulsivity that crutches would be a safety disaster at this point.  Recommending RW and/or knee scooter. Repeated cuing to take it easy and insure NWBing. ?   ? ?Recommendations for follow up therapy are one component of a multi-disciplinary discharge planning process, led by the attending physician.  Recommendations may be updated based on patient status, additional functional criteria and insurance authorization. ? ?Follow Up Recommendations Follow physician's recommendations for discharge plan and follow up therapies ? ?  ?Assistance Recommended at Discharge Set up Supervision/Assistance  ?Patient can return home with the following ? Assistance with cooking/housework;Assist for transportation ? ?  ?Equipment Recommendations None recommended by PT  ?Recommendations for Other Services ?    ?  ?Functional Status Assessment Patient has had a recent decline in their functional status and demonstrates the ability to make significant improvements in function in a reasonable and predictable amount of time.  ? ?   ?Precautions / Restrictions Precautions ?Precautions: Fall ?Restrictions ?Weight Bearing Restrictions: Yes ?RLE Weight Bearing: Non weight bearing  ? ?  ? ?Mobility ? Bed Mobility ?Overal bed mobility: Modified Independent ?  ?  ?  ?  ?  ?  ?General bed mobility comments: Pt able to get to sitting up in bed w/o issue or hesitation ?  ? ?Transfers ?Overall transfer level: Needs assistance ?Equipment used: Rolling walker (2 wheels) ?  ?  ?  ?  ?  ?  ?  ?General transfer comment: Cuing for set up/hand placement, insuring R NWBing and appropriate use of RW ?  ? ?Ambulation/Gait ?Ambulation/Gait assistance: Min guard, Min assist ?Gait Distance (Feet): 50 Feet ?Assistive device: Rolling walker (2 wheels) ?  ?  ?  ?  ?General Gait Details: On flat surfaces with walker pt was able to hop and safely maintain R NWBing but despite consistent cuing was impulsive and too fast and showed little regard for seriousness of precautions. ? ?Stairs ?Stairs: Yes ?Stairs assistance: Min assist, Mod assist ?Stair Management: One rail Right, Seated/boosting ?Number of Stairs: 4 ?General stair comments: Pt able to get down to sittingon steps and show ability to boost up, however in getting back to standing with hips 2 steps higher than feet and with rail(despite heavy cuing) she clearly put weight through R LE.  We attempted to try hopping up steps with R rail but again, despite heavy cuing, she put weight through R LE with little to no regard about trying to rectify this despite ostensibly being very aware about the implications and how she struggled with NWBing/healing 11 years ago ? ?Wheelchair Mobility ?  ? ?  Modified Rankin (Stroke Patients Only) ?  ? ?  ? ?Balance Overall balance assessment: Needs assistance ?  ?Sitting balance-Leahy Scale: Normal ?  ?  ?Standing balance support: Bilateral upper extremity supported ?Standing balance-Leahy Scale: Fair ?Standing balance comment: Pt with no LOBs, and when focused maintained balance and  NWBing relatively well with walker... but despite serious conversation about insuring NWBing and allowing sx to heal she consistently showed impulsiveness and putting weight on the R ?  ?  ?  ?  ?  ?  ?  ?  ?  ?  ?  ?   ? ? ? ?Pertinent Vitals/Pain Pain Assessment ?Pain Assessment:  (R LE still numb, feels little to nothing)  ? ? ?Home Living Family/patient expects to be discharged to:: Private residence ?Living Arrangements: Other relatives ?Available Help at Discharge: Family ?Type of Home: House ?Home Access: Level entry ?  ?  ?Alternate Level Stairs-Number of Steps: flight ?Home Layout: Two level ?Home Equipment:  (aunt has a walker, she does use it) ?   ?  ?Prior Function Prior Level of Function : Independent/Modified Independent ?  ?  ?  ?  ?  ?  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  ?  ? ? ?Hand Dominance  ?   ? ?  ?Extremity/Trunk Assessment  ? Upper Extremity Assessment ?Upper Extremity Assessment: Overall WFL for tasks assessed ?  ? ?Lower Extremity Assessment ?Lower Extremity Assessment: RLE deficits/detail ?RLE Deficits / Details: still numb in R LE, no AROM for even wiggling toes ?  ? ?   ?Communication  ? Communication: No difficulties  ?Cognition Arousal/Alertness: Awake/alert ?Behavior During Therapy: [facility] for tasks assessed/performed, Impulsive ?Overall Cognitive Status: Within Functional Limits for tasks assessed ?  ?  ?  ?  ?  ?  ?  ?  ?  ?  ?  ?  ?  ?  ?  ?  ?  ?  ?  ? ?  ?General Comments General comments (skin integrity, edema, etc.): Pt impulsive, speaks about NWBing compliance while at the same time placing foot on the ground. ? ?  ?Exercises    ? ?Assessment/Plan  ?  ?PT Assessment Patient needs continued PT services  ?PT Problem List Decreased strength;Decreased range of motion;Decreased balance;Decreased activity tolerance;Decreased mobility;Decreased knowledge of use of DME;Decreased coordination;Decreased safety awareness;Decreased  knowledge of precautions;Pain ? ?   ?  ?PT Treatment Interventions DME instruction;Gait training;Stair training;Functional mobility training;Therapeutic activities;Therapeutic exercise;Balance training;Neuromuscular re-education;Patient/family education   ? ?PT Goals (Current goals can be found in the Care Plan section)  ?Acute Rehab PT Goals ?Patient Stated Goal: Pt wanting to go back to work next week, instructed that this is not likely ?PT Goal Formulation: With patient ?Time For Goal Achievement: 01/30/22 ?Potential to Achieve Goals: Fair ? ?  ?Frequency 7X/week ?  ? ? ?Co-evaluation   ?  ?  ?  ?  ? ? ?  ?AM-PAC PT "6 Clicks" Mobility  ?Outcome Measure Help needed turning from your back to your side while in a flat bed without using bedrails?: None ?Help needed moving from lying on your back to sitting on the side of a flat bed without using bedrails?: None ?Help needed moving to and from a bed to a chair (including a wheelchair)?: None ?Help needed standing up from a chair using your arms (e.g., wheelchair or bedside chair)?: A Little ?Help needed to walk in hospital room?: A Little ?Help needed  climbing 3-5 steps with a railing? : Total ?6 Click Score: 19 ? ?  ?End of Session Equipment Utilized During Treatment: Gait belt ?Activity Tolerance: Patient tolerated treatment well ?Patient left: with bed alarm set;with call bell/phone within reach ?Nurse Communication: Mobility status;Weight bearing status ?PT Visit Diagnosis: Unsteadiness on feet (R26.81);Difficulty in walking, not elsewhere classified (R26.2);History of falling (Z91.81);Pain ?Pain - Right/Left: Right ?Pain - part of body: Ankle and joints of foot ?  ? ?Time: 7846-9629 ?PT Time Calculation (min) (ACUTE ONLY): 25 min ? ? ?Charges:   PT Evaluation ?$PT Eval Low Complexity: 1 Low ?PT Treatments ?$Gait Training: 8-22 mins ?  ?   ? ? ?Malachi Pro, DPT ?01/16/2022, 10:53 AM ?

## 2022-01-16 NOTE — Discharge Instructions (Signed)
Non weight bearing on the right lower extremity.   ? ?Elevate the right lower extremity whenever possible.  ?Take aspirin 81 mg 1 daily X 30 days as directed for blood clot prevention. ? ?Continue to use knee scooter ? ?Call [phone] with any questions, such as fever > 101.5 degrees, drainage from the wound or shortness of breath. ?

## 2022-01-16 NOTE — TOC Progression Note (Signed)
Transition of Care (TOC) - Progression Note  ? ? ?Patient Details  ?Name: Mindy Gali ?MRN: [id number] ?Date of Birth: [birth year] ? ?Transition of Care (TOC) CM/SW Contact  ?Khalin Royce A Parley Pidcock, LCSW ?Phone Number: ?01/16/2022, 11:13 AM ? ?Clinical Narrative:   Per PA, a knee scooter was ordered through their office this morning.  ? ? ? ?  ?  ? ?Expected Discharge Plan and Services ?  ?  ?  ?  ?  ?                ?  ?  ?  ?  ?  ?  ?  ?  ?  ?  ? ? ?Social Determinants of Health (SDOH) Interventions ?  ? ?Readmission Risk Interventions ?No flowsheet data found. ? ?

## 2022-01-17 NOTE — Anesthesia Postprocedure Evaluation (Signed)
Anesthesia Post Note ? ?Patient: Joyce Conley ? ?Procedure(s) Performed: OPEN REDUCTION INTERNAL FIXATION (ORIF) ANKLE FRACTURE (Right: Ankle) ? ?Patient location during evaluation: PACU ?Anesthesia Type: Regional ?Level of consciousness: awake and alert ?Pain management: pain level controlled ?Vital Signs Assessment: post-procedure vital signs reviewed and stable ?Respiratory status: spontaneous breathing, nonlabored ventilation, respiratory function stable and patient connected to nasal cannula oxygen ?Cardiovascular status: blood pressure returned to baseline and stable ?Postop Assessment: no apparent nausea or vomiting ?Anesthetic complications: no ? ? ?No notable events documented. ? ? ?Last Vitals:  ?Vitals:  ? 01/16/22 0430 01/16/22 0810  ?BP: 119/63 (!) 148/79  ?Pulse: 69 66  ?Resp: 17 15  ?Temp: 36.5 ?C 36.5 ?C  ?SpO2: 96% 100%  ?  ?Last Pain:  ?Vitals:  ? 01/16/22 1034  ?TempSrc:   ?PainSc: 0-No pain  ? ? ?  ?  ?  ?  ?  ?  ? ?Yevette Edwards ? ? ? ? ?

## 2022-02-27 ENCOUNTER — Other Ambulatory Visit: Payer: Self-pay

## 2022-02-27 MED ORDER — CEPHALEXIN 500 MG PO CAPS
ORAL_CAPSULE | ORAL | 0 refills | Status: DC
  Filled 2022-02-27: qty 20, 5d supply, fill #0

## 2022-02-28 ENCOUNTER — Other Ambulatory Visit: Payer: Self-pay

## 2022-03-13 ENCOUNTER — Ambulatory Visit: Payer: Self-pay | Attending: Orthopedic Surgery

## 2022-03-13 DIAGNOSIS — M25571 Pain in right ankle and joints of right foot: Secondary | ICD-10-CM

## 2022-03-13 DIAGNOSIS — Z9889 Other specified postprocedural states: Secondary | ICD-10-CM

## 2022-03-13 DIAGNOSIS — R262 Difficulty in walking, not elsewhere classified: Secondary | ICD-10-CM

## 2022-03-13 DIAGNOSIS — Z8781 Personal history of (healed) traumatic fracture: Secondary | ICD-10-CM | POA: Insufficient documentation

## 2022-03-13 DIAGNOSIS — M6281 Muscle weakness (generalized): Secondary | ICD-10-CM

## 2022-03-13 NOTE — Therapy (Signed)
[place] [facility] ?[address] ?[place], [place], [address] ?Phone: [phone]   Fax:  [phone] ? ?Physical Therapy Evaluation ? ?Patient Details  ?Name: Joyce Conley ?MRN: [id number] ?Date of Birth: 07-Apr-1981 ?Referring Provider (PT): Dr. Cassell Smiles ? ? ?Encounter Date: 03/13/2022 ? ? PT End of Session - 03/13/22 2309   ? ? Visit Number 1   ? Number of Visits 25   ? Date for PT Re-Evaluation 06/05/22   ? Authorization Time Period 03/13/2022-06/05/2022   ? Progress Note Due on Visit 10   ? PT Start Time 1100   ? PT Stop Time 1148   ? PT Time Calculation (min) 48 min   ? Equipment Utilized During Treatment Gait belt;Other (comment)   Right ASO brace  ? Activity Tolerance Patient tolerated treatment well;No increased pain   ? Behavior During Therapy [facility] for tasks assessed/performed   ? ?  ?  ? ?  ? ? ?History reviewed. No pertinent past medical history. ? ?Past Surgical History:  ?Procedure Laterality Date  ? ANKLE SURGERY Right 2011  ? CESAREAN SECTION    ? x2  ? ORIF ANKLE FRACTURE Right 01/15/2022  ? Procedure: OPEN REDUCTION INTERNAL FIXATION (ORIF) ANKLE FRACTURE;  Surgeon: Lyndle Herrlich, MD;  Location: [facility] ORS;  Service: Orthopedics;  Laterality: Right;  ? TUBAL LIGATION    ? ? ?There were no vitals filed for this visit. ? ? ? Subjective Assessment - 03/13/22 1116   ? ? Subjective Patient reports that she fell off a step ladder while attempted to change batteries in smoke detector and fractured her right ankle that required surgery on 01/15/2022   ? Pertinent History 41 year old female who sustained a right ankle bimalleolar fracture after falling off step ladder and subsequent ORIF surgery on 01/15/2022. Patient presents with order for PT now with WBAT orders and wearing an ASO brace. Patient reports previous similar injury back in 2010. SHe states she works as and Conservation officer, nature here at [employer] and has been walking around with ASO Brace. No other significant  past medical history   ? Limitations Lifting;Standing;Walking;House hold activities   ? How long can you sit comfortably? no issues   ? How long can you stand comfortably? 30 min   ? How long can you walk comfortably? about a mile   ? Diagnostic tests 01/15/2022- IMPRESSION:  Bimalleolar fractures of the right ankle with lateral displacement.   ? Patient Stated Goals Jog again, Improved ROM, Return to previous level of function as able   ? Currently in Pain? Yes   ? Pain Score 7    ? Pain Location Foot   Top of foot  ? Pain Orientation Right;Anterior   ? Pain Descriptors / Indicators Aching;Sore   ? Pain Type Surgical pain   ? Pain Onset More than a month ago   ? Pain Frequency Intermittent   ? Aggravating Factors  prolonged Standing walking   ? Pain Relieving Factors Rest   ? Effect of Pain on Daily Activities Difficulty with walking   ? ?  ?  ? ?  ? ? ? ? ? [facility] PT Assessment - 03/13/22 1118   ? ?  ? Assessment  ? Medical Diagnosis ORIF R ankle/ bimalleolar fx   ? Referring Provider (PT) Dr. Cassell Smiles   ? Onset Date/Surgical Date 01/15/22   ? Hand Dominance Left   ? Next MD Visit 03/23/2022   ? Prior Therapy  none   ?  ? Precautions  ? Precautions Fall   ? Required Braces or Orthoses Other Brace/Splint   Wearing ASO for work and community  ?  ? Restrictions  ? Weight Bearing Restrictions Yes   ? RLE Weight Bearing Weight bearing as tolerated   ?  ? Balance Screen  ? Has the patient fallen in the past 6 months No   ? Has the patient had a decrease in activity level because of a fear of falling?  Yes   ? Is the patient reluctant to leave their home because of a fear of falling?  No   ?  ? Home Environment  ? Living Environment Private residence   ? Living Arrangements Other relatives   ? Available Help at Discharge Family   ? Type of Home House   ? Home Access Level entry   ? Home Layout Two level   ? Alternate Level Stairs-Number of Steps 14 steps   ? Alternate Level Stairs-Rails Left   ? Home Equipment Walker - 2  wheels;Shower seat;Other (comment)   knee scooter  ?  ? Prior Function  ? Level of Independence Independent   ? Vocation Full time employment   ? 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 Leisure Go to beach/boating/garden   ?  ? Cognition  ? Overall Cognitive Status Within Functional Limits for tasks assessed   ? ?  ?  ? ?  ? ? ? ?Red Flags (fever/chills, dysarthria, dysphagia, bowel and bladder changes, recent weight loss/gain, personal history of cancer, night pain): No ? ? ?OBJECTIVE ? ?MUSCULOSKELETAL: ?Tremor: Absent ?Bulk: Normal ?Tone: Normal, no clonus ?No trophic changes noted to foot/ankle. No ecchymosis, erythema, or edema noted. No gross ankle/foot deformity noted ? ?Lumbar/Hip/Knee Screen ?AROM: WFL and painless with overpressure in all planes ? ?Posture ?No gross abnormalities noted in seated or standing posture ? ?Gait ?Mild Right Foot ER with walking with decreased heel to toe gait - walking without use of AD and only wearing ASO brace with tennis shoes.  ? ?Palpation ?Mild tenderness/ pain to palpation along R medial and lateral malleoli. Mild pain reported over dorsum of right foot.  Achilles tendon intact and painless to palpation ? ?Strength ?R/L ?4/5 Hip flexion ?4/5 Hip external rotation ?5/5 Hip internal rotation ?5/5 Hip extension  ?4/5 Hip abduction ?5/5 Hip adduction ?4/5 Knee extension ?4/5 Knee flexion ?2-/5 Ankle Plantarflexion ?2-/5 Ankle Dorsiflexion ?2-/5 Ankle Inversion ?2-/5 Ankle Eversion ? ?AROM ?Right ankle  ?25* Ankle Plantarflexion ?4* deg Ankle Dorsiflexion ?12* deg Ankle Inversion ?7* deg Ankle Eversion ?*Indicates Pain ? ?Sensation ?Grossly intact to light touch bilateral LEs as determined by testing dermatomes L2-S2 ?Proprioception and hot/cold testing deferred on this date ? ?VASCULAR ?Dorsalis pedis and posterior tibial pulses are palpable ? ? ? ?ASSESSMENT ?Pt is a pleasant 41 year-old female referred to PT with recent ORIF Right ankle on  01/15/2022. PT examination reveals deficits including limited right ankle ROM, Right LE muscle weakness, Difficulty walking and pain limited mobility with edema. Patient will benefit from PT services to address deficits in strength, mobility, and pain in order to return to full function at home/work with less ankle/foot pain.   ? ?  ? ? ? ? ? ? ?Access Code: [email or web address] ?URL: [email or web address] ?Date: 03/13/2022 ?Prepared by: Maureen Ralphs ? ?Exercises ?- Supine Ankle Dorsiflexion and Plantarflexion AROM  - 1 x daily - 7 x weekly - 3 sets - 10  reps ?- Supine Ankle Inversion Eversion AROM  - 1 x daily - 7 x weekly - 3 sets - 10 reps ?- Supine Ankle Inversion Eversion AROM  - 1 x daily - 7 x weekly - 3 sets - 10 reps ?- Supine Ankle Circles  - 1 x daily - 7 x weekly - 3 sets - 10 reps ? ? ? ? ? ? ?Objective measurements completed on examination: See above findings.  ? ? ? ? ? ? ? ? ? ? ? ? ? ? PT Education - 03/13/22 2309   ? ? Education Details purpose of PT; HEP   ? Person(s) Educated Patient   ? Methods Explanation;Demonstration;Tactile cues;Verbal cues;Handout   ? Comprehension Verbalized understanding;Returned demonstration;Tactile cues required;Verbal cues required;Need further instruction   ? ?  ?  ? ?  ? ? ? PT Short Term Goals - 03/13/22 2332   ? ?  ? PT SHORT TERM GOAL #1  ? Title Pt will be independent with HEP in order to decrease ankle pain and increase strength in order to improve pain-free function at home and work.   ? Baseline 03/13/2022= No formal HEP in place   ? Time 6   ? Period Weeks   ? Status New   ? Target Date 04/24/22   ? ?  ?  ? ?  ? ? ? ? PT Long Term Goals - 03/13/22 2333   ? ?  ? PT LONG TERM GOAL #1  ? Title Pt will be independent with final HEP in order to decrease ankle pain and increase strength in order to improve pain-free function at home and work.   ? Baseline 03/13/2022= no formal HEP in place   ? Time 12   ? Period Weeks   ? Status New   ? Target Date 06/05/22   ?   ? PT LONG TERM GOAL #2  ? Title Pt will improve FOTO to target score of 71 to display perceived improvements in ability to complete ADL's.   ? Baseline 03/13/2022= 57   ? Time 12   ? Period Weeks   ? Statu

## 2022-03-16 ENCOUNTER — Telehealth: Payer: Self-pay | Admitting: Physical Therapy

## 2022-03-16 ENCOUNTER — Ambulatory Visit: Payer: Self-pay | Admitting: Physical Therapy

## 2022-03-16 NOTE — Telephone Encounter (Signed)
Pt contacted regarding missed appointment on 5/12 and reminded of upcomming appointment.  ? ?-Rivka Barbara PT, DPT  ? ?

## 2022-03-19 ENCOUNTER — Ambulatory Visit: Payer: Self-pay

## 2022-03-19 DIAGNOSIS — M25571 Pain in right ankle and joints of right foot: Secondary | ICD-10-CM

## 2022-03-19 DIAGNOSIS — Z9889 Other specified postprocedural states: Secondary | ICD-10-CM

## 2022-03-19 DIAGNOSIS — R262 Difficulty in walking, not elsewhere classified: Secondary | ICD-10-CM

## 2022-03-19 DIAGNOSIS — M6281 Muscle weakness (generalized): Secondary | ICD-10-CM

## 2022-03-19 NOTE — Therapy (Signed)
[place] [facility] ?[address] ?[place], [place], [address] ?Phone: [phone]   Fax:  [phone] ? ?Physical Therapy Treatment ? ?Patient Details  ?Name: Joyce Conley ?MRN: [id number] ?Date of Birth: April 12, 1981 ?Referring Provider (PT): Dr. Cassell Smiles ? ? ?Encounter Date: 03/19/2022 ? ? PT End of Session - 03/19/22 1025   ? ? Visit Number 2   ? Number of Visits 25   ? Date for PT Re-Evaluation 06/05/22   ? Authorization Time Period 03/13/2022-06/05/2022   ? Authorization - Number of Visits 1   ? Progress Note Due on Visit 10   ? PT Start Time 0845   ? PT Stop Time 0929   ? PT Time Calculation (min) 44 min   ? Equipment Utilized During Treatment Gait belt;Other (comment)   Right ASO brace  ? Activity Tolerance Patient tolerated treatment well;No increased pain   ? Behavior During Therapy [facility] for tasks assessed/performed   ? ?  ?  ? ?  ? ? ?History reviewed. No pertinent past medical history. ? ?Past Surgical History:  ?Procedure Laterality Date  ? ANKLE SURGERY Right 2011  ? CESAREAN SECTION    ? x2  ? ORIF ANKLE FRACTURE Right 01/15/2022  ? Procedure: OPEN REDUCTION INTERNAL FIXATION (ORIF) ANKLE FRACTURE;  Surgeon: Lyndle Herrlich, MD;  Location: [facility] ORS;  Service: Orthopedics;  Laterality: Right;  ? TUBAL LIGATION    ? ? ?There were no vitals filed for this visit. ? ? Subjective Assessment - 03/19/22 1024   ? ? Subjective Patient reports she is seeing her doctor soon, will ask for a new ankle brace.   ? Pertinent History 41 year old female who sustained a right ankle bimalleolar fracture after falling off step ladder and subsequent ORIF surgery on 01/15/2022. Patient presents with order for PT now with WBAT orders and wearing an ASO brace. Patient reports previous similar injury back in 2010. 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 No other significant past medical history   ? Limitations  Lifting;Standing;Walking;House hold activities   ? How long can you sit comfortably? no issues   ? How long can you stand comfortably? 30 min   ? How long can you walk comfortably? about a mile   ? Diagnostic tests 01/15/2022- IMPRESSION:  Bimalleolar fractures of the right ankle with lateral displacement.   ? Patient Stated Goals Jog again, Improved ROM, Return to previous level of function as able   ? Currently in Pain? Yes   ? Pain Score 4    ? Pain Location Foot   ? Pain Orientation Right   ? Pain Descriptors / Indicators Aching   ? Pain Type Surgical pain   ? Pain Onset More than a month ago   ? Pain Frequency Intermittent   ? ?  ?  ? ?  ? ? ? ? ? ?Manual: ?Long sitting: ?Milking massage for swelling reduction x 6 minutes ?PROM progressive df 5x20 seconds, progressive pf 5x20 seconds ?AP talocrural mobilizations  ? ?TherEx:  ?Long sitting: ?4 way ankle strengthening RTB 10x each direction ?Ankle pumps (added to HEP) ? ?Standing: ?Single limb stance RLE 30 seconds ?Airex pad single limb stance with UE support 30 seconds x 2 trials ?Half foam roller flat side up: df/pf 15x  ? ?Seated; ?Dyadisc: circle 10x each direction ? ?Pt educated throughout session about proper posture and technique with exercises. Improved  exercise technique, movement at target joints, use of target muscles after min to mod verbal, visual, tactile cues. ? ?Patient educated on swelling reduction techniques, increased available ROM techniques, and stabilization. She is highly motivated and eager to progress her mobility. Swelling is reduced with milking massage and patient reports positive effect with pain control. Patient will benefit from PT services to address deficits in strength, mobility, and pain in order to return to full function at home/work with less ankle/foot pain. ? ? ? ? ? ? ? ? ? ? ? ? ? ? ? ? ? ? ? ? PT Education - 03/19/22 1024   ? ? Education Details exercise technique, body mechanics   ? Person(s) Educated Patient   ?  Methods Explanation;Demonstration;Tactile cues;Verbal cues   ? Comprehension Verbalized understanding;Returned demonstration;Verbal cues required;Tactile cues required   ? ?  ?  ? ?  ? ? ? PT Short Term Goals - 03/13/22 2332   ? ?  ? PT SHORT TERM GOAL #1  ? Title Pt will be independent with HEP in order to decrease ankle pain and increase strength in order to improve pain-free function at home and work.   ? Baseline 03/13/2022= No formal HEP in place   ? Time 6   ? Period Weeks   ? Status New   ? Target Date 04/24/22   ? ?  ?  ? ?  ? ? ? ? PT Long Term Goals - 03/13/22 2333   ? ?  ? PT LONG TERM GOAL #1  ? Title Pt will be independent with final HEP in order to decrease ankle pain and increase strength in order to improve pain-free function at home and work.   ? Baseline 03/13/2022= no formal HEP in place   ? Time 12   ? Period Weeks   ? Status New   ? Target Date 06/05/22   ?  ? PT LONG TERM GOAL #2  ? Title Pt will improve FOTO to target score of 71 to display perceived improvements in ability to complete ADL's.   ? Baseline 03/13/2022= 57   ? Time 12   ? Period Weeks   ? Status New   ? Target Date 06/05/22   ?  ? PT LONG TERM GOAL #3  ? Title Pt will decrease worst pain as reported on NPRS by at least 3 points in order to demonstrate clinically significant reduction in ankle/foot pain.   ? Baseline 03/13/2022= 7/10 at worst   ? Time 12   ? Period Weeks   ? Status New   ? Target Date 06/05/22   ?  ? PT LONG TERM GOAL #4  ? Title Pt will increase right ankle strength of  by at least 1/2 MMT grade in order to demonstrate improvement in strength and function   ? Baseline 03/13/2022= 2-/5 Right ankle DF/PF/EV/IV   ? Time 12   ? Period Weeks   ? Status New   ? Target Date 06/05/22   ?  ? PT LONG TERM GOAL #5  ? Title Patient will demo 50% improvement in right ankle ROM for improved overall ROM to facilitate more normalised gait pattern including heel to toe sequencing.   ? Baseline 03/13/2022: DF= 4; PF= 25 deg; EV= 7 deg; IV= 12  deg   ? Time 12   ? Period Weeks   ? Status New   ? Target Date 06/05/22   ? ?  ?  ? ?  ? ? ? ? ? ? ? ?  Plan - 03/19/22 1036   ? ? Clinical Impression Statement Patient educated on swelling reduction techniques, increased available ROM techniques, and stabilization. She is highly motivated and eager to progress her mobility. Swelling is reduced with milking massage and patient reports positive effect with pain control. Patient will benefit from PT services to address deficits in strength, mobility, and pain in order to return to full function at home/work with less ankle/foot pain.   ? Examination-Activity Limitations Lift;Squat;Stairs;Stand   ? Examination-Participation Restrictions Community Activity;Occupation;[employer]   ? Stability/Clinical Decision Making Stable/Uncomplicated   ? Rehab Potential Good   ? PT Frequency 2x / week   ? PT Duration 12 weeks   ? PT Treatment/Interventions ADLs/Self Care Home Management;Cryotherapy;Electrical Stimulation;Moist Heat;Ultrasound;DME Instruction;Gait training;Stair training;Functional mobility training;Therapeutic activities;Therapeutic exercise;Neuromuscular re-education;Orthotic Fit/Training;Balance training;Patient/family education;Manual techniques;Compression bandaging;Scar mobilization;Passive range of motion;Dry needling;Splinting;Taping   ? PT Next Visit Plan Review and progress LE ROM/strengthening   ? Consulted and Agree with Plan of Care Patient   ? ?  ?  ? ?  ? ? ?Patient will benefit from skilled therapeutic intervention in order to improve the following deficits and impairments:  Abnormal gait, Decreased balance, Decreased endurance, Decreased mobility, Decreased range of motion, Decreased skin integrity, Decreased strength, Difficulty walking, Hypomobility, Increased edema, Impaired flexibility, Pain ? ?Visit Diagnosis: ?Difficulty in walking, not elsewhere classified ? ?Muscle weakness (generalized) ? ?Pain in right ankle and joints of right foot ? ?Status  post open reduction with internal fixation (ORIF) of fracture of ankle ? ? ? ? ?Problem List ?Patient Active Problem List  ? Diagnosis Date Noted  ? Closed right ankle fracture 01/15/2022  ? ? ?Precious BardMarina Jaelee Laughter, PT, DPT

## 2022-03-21 ENCOUNTER — Ambulatory Visit: Payer: Self-pay

## 2022-03-29 ENCOUNTER — Ambulatory Visit: Payer: Self-pay

## 2022-03-30 ENCOUNTER — Ambulatory Visit: Payer: Self-pay | Admitting: Physical Therapy

## 2022-03-30 DIAGNOSIS — R262 Difficulty in walking, not elsewhere classified: Secondary | ICD-10-CM

## 2022-03-30 DIAGNOSIS — M25571 Pain in right ankle and joints of right foot: Secondary | ICD-10-CM

## 2022-03-30 DIAGNOSIS — M6281 Muscle weakness (generalized): Secondary | ICD-10-CM

## 2022-03-30 NOTE — Therapy (Signed)
[place] MAIN [facility] SERVICES [address] [place], [place], [address] Phone: [phone]   Fax:  [phone]  Physical Therapy Treatment  Patient Details  Name: Joyce Conley MRN: [id number] Date of Birth: 08/27/1981 Referring Provider (PT): Dr. Kurtis Bushman   Encounter Date: 03/30/2022   PT End of Session - 03/30/22 1055     Visit Number 3    Number of Visits 25    Date for PT Re-Evaluation 06/05/22    Authorization Time Period 03/13/2022-06/05/2022    Authorization - Number of Visits 1    Progress Note Due on Visit 10    PT Start Time 0930    PT Stop Time 1013    PT Time Calculation (min) 43 min    Equipment Utilized During Treatment Gait belt;Other (comment)   Right ASO brace   Activity Tolerance Patient tolerated treatment well;No increased pain    Behavior During Therapy [facility] for tasks assessed/performed             No past medical history on file.  Past Surgical History:  Procedure Laterality Date   ANKLE SURGERY Right 2011   CESAREAN SECTION     x2   ORIF ANKLE FRACTURE Right 01/15/2022   Procedure: OPEN REDUCTION INTERNAL FIXATION (ORIF) ANKLE FRACTURE;  Surgeon: Lovell Sheehan, MD;  Location: [facility] ORS;  Service: Orthopedics;  Laterality: Right;   TUBAL LIGATION      There were no vitals filed for this visit.   Subjective Assessment - 03/30/22 1058     Subjective Pt reports no significant changes since last session. Still wants to request new brace from MD.    Pertinent History 41 year old female who sustained a right ankle bimalleolar fracture after falling off step ladder and subsequent ORIF surgery on 01/15/2022. Patient presents with order for PT now with WBAT orders and wearing an ASO brace. Patient reports previous similar injury back in 2010. 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 No other significant past medical history    Limitations  Lifting;Standing;Walking;House hold activities    How long can you sit comfortably? no issues    How long can you stand comfortably? 30 min    How long can you walk comfortably? about a mile    Diagnostic tests 01/15/2022- IMPRESSION:  Bimalleolar fractures of the right ankle with lateral displacement.    Patient Stated Goals Jog again, Improved ROM, Return to previous level of function as able    Currently in Pain? No/denies    Pain Onset More than a month ago             Manual: Long sitting: Anterograde massage for swelling reduction x 6 minutes PROM progressive df 3x20 seconds, progressive pf 3x20 seconds AP talocrural mobilizations x multiple reps  -no pain/ adverse response to any intervention this session.   TherEx:  Long sitting: 4 way ankle strengthening RTB 10x each direction   Standing: Single limb stance RLE 30 seconds Airex pad single limb stance with intermittent UE support 2 x 30 seconds, unable to complete without UE support  Tandem stance airex 2 x 30 seconds with R LE posterior, able to complete without uE support on second trial SLS   Seated; Seated rocker board, frontal plane movement, sagittal plane movement, interplanar movement x 20 each direction, increased difficulty  Pt educated throughout session about proper posture and technique with exercises. Improved exercise technique,  movement at target joints, use of target muscles after min to mod verbal, visual, tactile cues.  Pt instructed she could walk around level surfaces of her home without brace as her pain and swelling tolerates.   Rationale for Evaluation and Treatment Rehabilitation                               PT Short Term Goals - 03/13/22 2332       PT SHORT TERM GOAL #1   Title Pt will be independent with HEP in order to decrease ankle pain and increase strength in order to improve pain-free function at home and work.    Baseline 03/13/2022= No formal HEP in  place    Time 6    Period Weeks    Status New    Target Date 04/24/22               PT Long Term Goals - 03/13/22 2333       PT LONG TERM GOAL #1   Title Pt will be independent with final HEP in order to decrease ankle pain and increase strength in order to improve pain-free function at home and work.    Baseline 03/13/2022= no formal HEP in place    Time 12    Period Weeks    Status New    Target Date 06/05/22      PT LONG TERM GOAL #2   Title Pt will improve FOTO to target score of 71 to display perceived improvements in ability to complete ADL's.    Baseline 03/13/2022= 57    Time 12    Period Weeks    Status New    Target Date 06/05/22      PT LONG TERM GOAL #3   Title Pt will decrease worst pain as reported on NPRS by at least 3 points in order to demonstrate clinically significant reduction in ankle/foot pain.    Baseline 03/13/2022= 7/10 at worst    Time 12    Period Weeks    Status New    Target Date 06/05/22      PT LONG TERM GOAL #4   Title Pt will increase right ankle strength of  by at least 1/2 MMT grade in order to demonstrate improvement in strength and function    Baseline 03/13/2022= 2-/5 Right ankle DF/PF/EV/IV    Time 12    Period Weeks    Status New    Target Date 06/05/22      PT LONG TERM GOAL #5   Title Patient will demo 50% improvement in right ankle ROM for improved overall ROM to facilitate more normalised gait pattern including heel to toe sequencing.    Baseline 03/13/2022: DF= 4; PF= 25 deg; EV= 7 deg; IV= 12 deg    Time 12    Period Weeks    Status New    Target Date 06/05/22                   Plan - 03/30/22 1039     Clinical Impression Statement Pt presents with excelleint motivation for completion of PT exercises. She requires cues for proper recruitment of lateral and medial ankle musculature but with ciues pt does improve with this task. Pt continued dificulty with tasks such as single leg balance without UE support and has  quickness to fatigue with these tasks. Pt will continue to benefit from skilled PT interventions in  order to improve LE strength, ankle stability, and her QOL.    Examination-Activity Limitations Lift;Squat;Stairs;Stand    Examination-Participation Restrictions Community Activity;Occupation;Yard Work    Stability/Clinical Decision Making Stable/Uncomplicated    Rehab Potential Good    PT Frequency 2x / week    PT Duration 12 weeks    PT Treatment/Interventions ADLs/Self Care Home Management;Cryotherapy;Electrical Stimulation;Moist Heat;Ultrasound;DME Instruction;Gait training;Stair training;Functional mobility training;Therapeutic activities;Therapeutic exercise;Neuromuscular re-education;Orthotic Fit/Training;Balance training;Patient/family education;Manual techniques;Compression bandaging;Scar mobilization;Passive range of motion;Dry needling;Splinting;Taping    PT Next Visit Plan Review and progress LE ROM/strengthening    Consulted and Agree with Plan of Care Patient             Patient will benefit from skilled therapeutic intervention in order to improve the following deficits and impairments:  Abnormal gait, Decreased balance, Decreased endurance, Decreased mobility, Decreased range of motion, Decreased skin integrity, Decreased strength, Difficulty walking, Hypomobility, Increased edema, Impaired flexibility, Pain  Visit Diagnosis: Difficulty in walking, not elsewhere classified  Muscle weakness (generalized)  Pain in right ankle and joints of right foot     Problem List Patient Active Problem List   Diagnosis Date Noted   Closed right ankle fracture 01/15/2022    Particia Lather, PT 03/30/2022, 11:00 AM  [place], [place], [address] Phone: [phone]   Fax:  [phone]  Name: Bona Ravi MRN: [id number] Date of Birth: Aug 11, 1981

## 2022-04-06 ENCOUNTER — Ambulatory Visit: Payer: Self-pay | Attending: Orthopedic Surgery

## 2022-04-06 DIAGNOSIS — M6281 Muscle weakness (generalized): Secondary | ICD-10-CM | POA: Insufficient documentation

## 2022-04-06 DIAGNOSIS — M25571 Pain in right ankle and joints of right foot: Secondary | ICD-10-CM | POA: Insufficient documentation

## 2022-04-06 DIAGNOSIS — Z9889 Other specified postprocedural states: Secondary | ICD-10-CM | POA: Insufficient documentation

## 2022-04-06 DIAGNOSIS — R262 Difficulty in walking, not elsewhere classified: Secondary | ICD-10-CM | POA: Insufficient documentation

## 2022-04-06 DIAGNOSIS — Z8781 Personal history of (healed) traumatic fracture: Secondary | ICD-10-CM | POA: Insufficient documentation

## 2022-04-06 NOTE — Therapy (Signed)
[place] [facility] MAIN [facility] SERVICES [address] [place], [place], [address] Phone: [phone]   Fax:  [phone]  Physical Therapy Treatment  Patient Details  Name: Joyce Conley MRN: [id number] Date of Birth: [birth year] Referring Provider (PT): Dr. Cassell Smiles   Encounter Date: 04/06/2022   PT End of Session - 04/06/22 0835     Visit Number 4    Number of Visits 25    Date for PT Re-Evaluation 06/05/22    Authorization Time Period 03/13/2022-06/05/2022    Authorization - Visit Number 4    Progress Note Due on Visit 10    PT Start Time 0835    PT Stop Time 0909    PT Time Calculation (min) 34 min    Equipment Utilized During Treatment Gait belt;Other (comment)   Right ASO brace   Activity Tolerance Patient tolerated treatment well;No increased pain    Behavior During Therapy [facility] for tasks assessed/performed             History reviewed. No pertinent past medical history.  Past Surgical History:  Procedure Laterality Date   ANKLE SURGERY Right 2011   CESAREAN SECTION     x2   ORIF ANKLE FRACTURE Right 01/15/2022   Procedure: OPEN REDUCTION INTERNAL FIXATION (ORIF) ANKLE FRACTURE;  Surgeon: Lyndle Herrlich, MD;  Location: [facility] ORS;  Service: Orthopedics;  Laterality: Right;   TUBAL LIGATION      There were no vitals filed for this visit.   Subjective Assessment - 04/06/22 0834     Subjective Patient reports increased overall ankle pain as this is her 3rd day of work in a row without much rest- rates at a 6/10. Reports compliance with use of ice and ankle strengthening HEP    Pertinent History 41 year old female who sustained a right ankle bimalleolar fracture after falling off step ladder and subsequent ORIF surgery on 01/15/2022. Patient presents with order for PT now with WBAT orders and wearing an ASO brace. Patient reports previous similar injury back in 2010. 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 No other significant past medical history    Limitations Lifting;Standing;Walking;House hold activities    How long can you sit comfortably? no issues    How long can you stand comfortably? 30 min    How long can you walk comfortably? about a mile    Diagnostic tests 01/15/2022- IMPRESSION:  Bimalleolar fractures of the right ankle with lateral displacement.    Patient Stated Goals Jog again, Improved ROM, Return to previous level of function as able    Currently in Pain? Yes    Pain Score 6     Pain Location Ankle    Pain Orientation Right;Anterior    Pain Descriptors / Indicators Aching;Sore    Pain Type Surgical pain    Pain Onset More than a month ago    Pain Frequency Intermittent    Aggravating Factors  Walking/prolonged standing    Pain Relieving Factors Rest    Effect of Pain on Daily Activities Difficulty with walking           Going back to [facility] next week- Mon or Tues for follow up-  Pre-treatment ROM:  DF= 2 deg PF= 28 deg IV= 6 deg EV= 3 deg   Manual: Long sitting: Retrograde massage with right Leg elevated for swelling reduction x 8 minutes PROM progressive DF/PF/IV/EV  3x20 seconds,  AP/PA  talocrural joint  mobilizations Grade 3 x multiple reps  Dorsal/ventral metatarsal joint mobs Grade 3 - along each metatarsal- multiple reps. -no pain/ adverse response to any intervention this session.    TherEx: (deferred resistance due to increased swelling today)  Long sitting: 4 way ankle AROM 10x each direction     Seated: Right LE Seated toe EHL lift while simultaneously pressing remaining 4 digits into towel on floor x 10 reps.  Seated toe EHL press down while raising other toes- attempted yet patient unable to achieve today.  Toe splay- 10 reps - VC for correct technique  Post-treatment ROM:  DF= 9 deg PF= 38 deg IV= 12 deg EV= 8 deg   Pt educated throughout session about proper posture and technique with exercises.  Improved exercise technique, movement at target joints, use of target muscles after min to mod verbal, visual, tactile cues.      Rationale for Evaluation and Treatment Rehabilitation                              PT Education - 04/06/22 0834     Education Details exercise technique    Person(s) Educated Patient    Methods Explanation;Demonstration;Tactile cues;Verbal cues    Comprehension Verbalized understanding;Returned demonstration;Verbal cues required;Tactile cues required;Need further instruction              PT Short Term Goals - 03/13/22 2332       PT SHORT TERM GOAL #1   Title Pt will be independent with HEP in order to decrease ankle pain and increase strength in order to improve pain-free function at home and work.    Baseline 03/13/2022= No formal HEP in place    Time 6    Period Weeks    Status New    Target Date 04/24/22               PT Long Term Goals - 03/13/22 2333       PT LONG TERM GOAL #1   Title Pt will be independent with final HEP in order to decrease ankle pain and increase strength in order to improve pain-free function at home and work.    Baseline 03/13/2022= no formal HEP in place    Time 12    Period Weeks    Status New    Target Date 06/05/22      PT LONG TERM GOAL #2   Title Pt will improve FOTO to target score of 71 to display perceived improvements in ability to complete ADL's.    Baseline 03/13/2022= 57    Time 12    Period Weeks    Status New    Target Date 06/05/22      PT LONG TERM GOAL #3   Title Pt will decrease worst pain as reported on NPRS by at least 3 points in order to demonstrate clinically significant reduction in ankle/foot pain.    Baseline 03/13/2022= 7/10 at worst    Time 12    Period Weeks    Status New    Target Date 06/05/22      PT LONG TERM GOAL #4   Title Pt will increase right ankle strength of  by at least 1/2 MMT grade in order to demonstrate improvement in strength and  function    Baseline 03/13/2022= 2-/5 Right ankle DF/PF/EV/IV    Time 12    Period Weeks    Status New  Target Date 06/05/22      PT LONG TERM GOAL #5   Title Patient will demo 50% improvement in right ankle ROM for improved overall ROM to facilitate more normalised gait pattern including heel to toe sequencing.    Baseline 03/13/2022: DF= 4; PF= 25 deg; EV= 7 deg; IV= 12 deg    Time 12    Period Weeks    Status New    Target Date 06/05/22                   Plan - 04/06/22 0835     Clinical Impression Statement Treatment focused on ROM/reduction in swelling today as patient presented with increased swelling and pain from being on her feet more working over past 3 days. She responded very well to manual therapy reporting "Feels much better" however did not rate at end of session. She was challenged with new foot intrinsic strengthening and will benefit from continued training. She will hopefully be able to resume more standing activities for strength/balance next session if not as swollen. Pt will continue to benefit from skilled PT interventions in order to improve LE strength, ankle stability, and her QOL.    Examination-Activity Limitations Lift;Squat;Stairs;Stand    Examination-Participation Restrictions Community Activity;Occupation;Yard Work    Stability/Clinical Decision Making Stable/Uncomplicated    Rehab Potential Good    PT Frequency 2x / week    PT Duration 12 weeks    PT Treatment/Interventions ADLs/Self Care Home Management;Cryotherapy;Electrical Stimulation;Moist Heat;Ultrasound;DME Instruction;Gait training;Stair training;Functional mobility training;Therapeutic activities;Therapeutic exercise;Neuromuscular re-education;Orthotic Fit/Training;Balance training;Patient/family education;Manual techniques;Compression bandaging;Scar mobilization;Passive range of motion;Dry needling;Splinting;Taping    PT Next Visit Plan Review and progress LE ROM/strengthening    Consulted  and Agree with Plan of Care Patient             Patient will benefit from skilled therapeutic intervention in order to improve the following deficits and impairments:  Abnormal gait, Decreased balance, Decreased endurance, Decreased mobility, Decreased range of motion, Decreased skin integrity, Decreased strength, Difficulty walking, Hypomobility, Increased edema, Impaired flexibility, Pain  Visit Diagnosis: Difficulty in walking, not elsewhere classified  Muscle weakness (generalized)  Pain in right ankle and joints of right foot  Status post open reduction with internal fixation (ORIF) of fracture of ankle     Problem List Patient Active Problem List   Diagnosis Date Noted   Closed right ankle fracture 01/15/2022    Lenda KelpJeffrey N Harriett Azar, PT 04/06/2022, 11:07 AM  [place] [facility] MAIN [facility] [address] [place], [place], [id number] Phone: [phone]   Fax:  [phone]  Name: Migdalia DkRachel Conley MRN: [id number] Date of Birth: Dec 28, 1980

## 2022-04-10 ENCOUNTER — Ambulatory Visit: Payer: Self-pay

## 2022-04-10 DIAGNOSIS — M25571 Pain in right ankle and joints of right foot: Secondary | ICD-10-CM

## 2022-04-10 DIAGNOSIS — M6281 Muscle weakness (generalized): Secondary | ICD-10-CM

## 2022-04-10 DIAGNOSIS — R262 Difficulty in walking, not elsewhere classified: Secondary | ICD-10-CM

## 2022-04-10 NOTE — Therapy (Signed)
[place] MAIN [facility] SERVICES [address] [place], [place], [address] Phone: [phone]   Fax:  [phone]  Physical Therapy Treatment  Patient Details  Name: Joyce Conley MRN: [id number] Date of Birth: 1981/05/02 Referring Provider (PT): Dr. Kurtis Bushman   Encounter Date: 04/10/2022   PT End of Session - 04/10/22 0838     Visit Number 5    Number of Visits 25    Date for PT Re-Evaluation 06/05/22    Authorization Time Period 03/13/2022-06/05/2022    Authorization - Visit Number 5    Progress Note Due on Visit 10    PT Start Time 0841    PT Stop Time 0923    PT Time Calculation (min) 42 min    Equipment Utilized During Treatment Gait belt;Other (comment)   Right ASO brace   Activity Tolerance Patient tolerated treatment well;No increased pain    Behavior During Therapy [facility] for tasks assessed/performed             History reviewed. No pertinent past medical history.  Past Surgical History:  Procedure Laterality Date   ANKLE SURGERY Right 2011   CESAREAN SECTION     x2   ORIF ANKLE FRACTURE Right 01/15/2022   Procedure: OPEN REDUCTION INTERNAL FIXATION (ORIF) ANKLE FRACTURE;  Surgeon: Lovell Sheehan, MD;  Location: [facility] ORS;  Service: Orthopedics;  Laterality: Right;   TUBAL LIGATION      There were no vitals filed for this visit.   Subjective Assessment - 04/10/22 0903     Subjective Patient will go to surgeon tomorrow. Has been having increased swelling and sensitivity.    Pertinent History 41 year old female who sustained a right ankle bimalleolar fracture after falling off step ladder and subsequent ORIF surgery on 01/15/2022. Patient presents with order for PT now with WBAT orders and wearing an ASO brace. Patient reports previous similar injury back in 2010. 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 No other significant past medical history    Limitations  Lifting;Standing;Walking;House hold activities    How long can you sit comfortably? no issues    How long can you stand comfortably? 30 min    How long can you walk comfortably? about a mile    Diagnostic tests 01/15/2022- IMPRESSION:  Bimalleolar fractures of the right ankle with lateral displacement.    Patient Stated Goals Jog again, Improved ROM, Return to previous level of function as able    Currently in Pain? Yes    Pain Score 6     Pain Location Ankle    Pain Orientation Right    Pain Descriptors / Indicators Aching    Pain Type Surgical pain    Pain Onset More than a month ago    Pain Frequency Intermittent               Patient will go to surgeon tomorrow. Has been having increased swelling and sensitivity.    Manual: Long sitting: Anterograde massage for swelling reduction x 9 minutes PROM progressive df 3x20 seconds, progressive pf 3x20 seconds AP talocrural mobilizations x multiple reps  -no pain/ adverse response to any intervention this session.    TherEx:  Long sitting: 4 way ankle strengthening RTB 10x each direction  toe abduction 10x   big toe extension/flexion 15x Clockwise circle 10x Counterclockwise circle 10x  Standing: Single limb stance RLE 30 seconds Airex pad single limb stance  with intermittent UE support 2 x 30 seconds, unable to complete without UE support  Stair: -gastroc stretch 30 seconds each LE -hamstring stretch 30 seconds each LE Sit to stand with dynadisc under LLE 10x    Seated; Seated dynadisc - frontal plane movement 10x - sagittal plane movement 10x -hamstring isometric 10x 3 second holds    Pt educated throughout session about proper posture and technique with exercises. Improved exercise technique, movement at target joints, use of target muscles after min to mod verbal, visual, tactile cues.    Patient continues to have excessive swelling of postsurgical site. She is going to physician tomorrow. Patient educated on  use of towel for desensitization training for limb.inversion is extremely challenging for patient and will continue to be an area of focus. Pt will continue to benefit from skilled PT interventions in order to improve LE strength, ankle stability, and her QOL                  PT Education - 04/10/22 0837     Education Details exercise technique, body mechanics    Person(s) Educated Patient    Methods Explanation;Demonstration;Tactile cues;Verbal cues    Comprehension Verbalized understanding;Returned demonstration;Verbal cues required;Tactile cues required              PT Short Term Goals - 03/13/22 2332       PT SHORT TERM GOAL #1   Title Pt will be independent with HEP in order to decrease ankle pain and increase strength in order to improve pain-free function at home and work.    Baseline 03/13/2022= No formal HEP in place    Time 6    Period Weeks    Status New    Target Date 04/24/22               PT Long Term Goals - 03/13/22 2333       PT LONG TERM GOAL #1   Title Pt will be independent with final HEP in order to decrease ankle pain and increase strength in order to improve pain-free function at home and work.    Baseline 03/13/2022= no formal HEP in place    Time 12    Period Weeks    Status New    Target Date 06/05/22      PT LONG TERM GOAL #2   Title Pt will improve FOTO to target score of 71 to display perceived improvements in ability to complete ADL's.    Baseline 03/13/2022= 57    Time 12    Period Weeks    Status New    Target Date 06/05/22      PT LONG TERM GOAL #3   Title Pt will decrease worst pain as reported on NPRS by at least 3 points in order to demonstrate clinically significant reduction in ankle/foot pain.    Baseline 03/13/2022= 7/10 at worst    Time 12    Period Weeks    Status New    Target Date 06/05/22      PT LONG TERM GOAL #4   Title Pt will increase right ankle strength of  by at least 1/2 MMT grade in order to  demonstrate improvement in strength and function    Baseline 03/13/2022= 2-/5 Right ankle DF/PF/EV/IV    Time 12    Period Weeks    Status New    Target Date 06/05/22      PT LONG TERM GOAL #5   Title Patient will demo  50% improvement in right ankle ROM for improved overall ROM to facilitate more normalised gait pattern including heel to toe sequencing.    Baseline 03/13/2022: DF= 4; PF= 25 deg; EV= 7 deg; IV= 12 deg    Time 12    Period Weeks    Status New    Target Date 06/05/22                   Plan - 04/10/22 1217     Clinical Impression Statement Patient continues to have excessive swelling of postsurgical site. She is going to physician tomorrow. Patient educated on use of towel for desensitization training for limb.inversion is extremely challenging for patient and will continue to be an area of focus. Pt will continue to benefit from skilled PT interventions in order to improve LE strength, ankle stability, and her QOL    Examination-Activity Limitations Lift;Squat;Stairs;Stand    Examination-Participation Restrictions Community Activity;Occupation;Yard Work    Stability/Clinical Decision Making Stable/Uncomplicated    Rehab Potential Good    PT Frequency 2x / week    PT Duration 12 weeks    PT Treatment/Interventions ADLs/Self Care Home Management;Cryotherapy;Electrical Stimulation;Moist Heat;Ultrasound;DME Instruction;Gait training;Stair training;Functional mobility training;Therapeutic activities;Therapeutic exercise;Neuromuscular re-education;Orthotic Fit/Training;Balance training;Patient/family education;Manual techniques;Compression bandaging;Scar mobilization;Passive range of motion;Dry needling;Splinting;Taping    PT Next Visit Plan Review and progress LE ROM/strengthening    Consulted and Agree with Plan of Care Patient             Patient will benefit from skilled therapeutic intervention in order to improve the following deficits and impairments:  Abnormal  gait, Decreased balance, Decreased endurance, Decreased mobility, Decreased range of motion, Decreased skin integrity, Decreased strength, Difficulty walking, Hypomobility, Increased edema, Impaired flexibility, Pain  Visit Diagnosis: Difficulty in walking, not elsewhere classified  Muscle weakness (generalized)  Pain in right ankle and joints of right foot     Problem List Patient Active Problem List   Diagnosis Date Noted   Closed right ankle fracture 01/15/2022    Janna Arch, PT, DPT  04/10/2022, 12:19 PM  [place] [address] [place], [place], [address] Phone: [phone]   Fax:  [phone]  Name: Bronte Dorantes MRN: [id number] Date of Birth: 1981/04/21

## 2022-04-12 ENCOUNTER — Ambulatory Visit: Payer: Self-pay

## 2022-04-17 ENCOUNTER — Ambulatory Visit: Payer: Self-pay

## 2022-04-17 DIAGNOSIS — R262 Difficulty in walking, not elsewhere classified: Secondary | ICD-10-CM

## 2022-04-17 DIAGNOSIS — M25571 Pain in right ankle and joints of right foot: Secondary | ICD-10-CM

## 2022-04-17 DIAGNOSIS — M6281 Muscle weakness (generalized): Secondary | ICD-10-CM

## 2022-04-17 NOTE — Therapy (Signed)
[facility] [address] [place], [place], [address] Phone: [phone]   Fax:  [phone]  Physical Therapy Treatment  Patient Details  Name: Joyce Conley MRN: [id number] Date of Birth: [birth year] Referring Provider (PT): Dr. Cassell SmilesJames Bowers   Encounter Date: 04/17/2022   PT End of Session - 04/17/22 0707     Visit Number 6    Number of Visits 25    Date for PT Re-Evaluation 06/05/22    Authorization Time Period 03/13/2022-06/05/2022    Authorization - Visit Number 6    Progress Note Due on Visit 10    PT Start Time 0715    PT Stop Time 0759    PT Time Calculation (min) 44 min    Equipment Utilized During Treatment Gait belt;Other (comment)   Right ASO brace   Activity Tolerance Patient tolerated treatment well;No increased pain    Behavior [facility] for tasks assessed/performed             History reviewed. No pertinent past medical history.  Past Surgical History:  Procedure Laterality Date   ANKLE SURGERY Right 2011   CESAREAN SECTION     x2   ORIF ANKLE FRACTURE Right 01/15/2022   Procedure: OPEN REDUCTION INTERNAL FIXATION (ORIF) ANKLE FRACTURE;  Surgeon: Joyce HerrlichBowers, James R, MD;  Location: [facility] ORS;  Service: Orthopedics;  Laterality: Right;   TUBAL LIGATION      There were no vitals filed for this visit.   Subjective Assessment - 04/17/22 0715     Subjective Patient came out of brace completely, saw physician last week. Reports physician was pleased with progress. Is having swelling from working the weekend.    Pertinent History 41 year old female who sustained a right ankle bimalleolar fracture after falling off step ladder and subsequent ORIF surgery on 01/15/2022. Patient presents with order for PT now with WBAT orders and wearing an ASO brace. Patient reports previous similar injury back in 2010. 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  No other significant past medical history    Limitations Lifting;Standing;Walking;House hold activities    How long can you sit comfortably? no issues    How long can you stand comfortably? 30 min    How long can you walk comfortably? about a mile    Diagnostic tests 01/15/2022- IMPRESSION:  Bimalleolar fractures of the right ankle with lateral displacement.    Patient Stated Goals Jog again, Improved ROM, Return to previous level of function as able    Currently in Pain? Yes    Pain Score 4     Pain Location Ankle    Pain Orientation Right    Pain Descriptors / Indicators Aching    Pain Type Surgical pain    Pain Onset More than a month ago    Pain Frequency Intermittent                Manual: Long sitting: Anterograde massage for swelling reduction x 9 minutes PROM progressive df 3x20 seconds, progressive pf 3x20 seconds AP talocrural mobilizations x multiple reps  -no pain/ adverse response to any intervention this session.    TherEx:  Long sitting: 4 way ankle strengthening RTB 10x each direction  toe abduction 10x   big toe extension/flexion 15x    Standing: Airex pad single limb stance with intermittent UE support 2 x 30 seconds, unable to complete without UE support  Airex pad tandem stance 30 seconds with intermittent UE support Stair: -gastroc stretch 30 seconds each LE -hamstring stretch 30 seconds each LE Tilt board: lateral weight shift 10x; static hold midline 30 seconds    Seated; Eversion LAQ 10x Inversion LAQ 10x    Pt educated throughout session about proper posture and technique with exercises. Improved exercise technique, movement at target joints, use of target muscles after min to mod verbal, visual, tactile cues.   Patient tolerates progression of standing interventions with use of tilt board and stabilization. She continues to be challenged with inversion. Her swelling has decreased since she has been cleared from using brace with decreased  demarcation of shin. Pt will continue to benefit from skilled PT interventions in order to improve LE strength, ankle stability, and her QOL                     PT Education - 04/17/22 0707     Education Details exercise technique, body mechanics    Person(s) Educated Patient    Methods Explanation;Demonstration;Tactile cues;Verbal cues    Comprehension Verbalized understanding;Returned demonstration;Verbal cues required;Tactile cues required              PT Short Term Goals - 03/13/22 2332       PT SHORT TERM GOAL #1   Title Pt will be independent with HEP in order to decrease ankle pain and increase strength in order to improve pain-free function at home and work.    Baseline 03/13/2022= No formal HEP in place    Time 6    Period Weeks    Status New    Target Date 04/24/22               PT Long Term Goals - 03/13/22 2333       PT LONG TERM GOAL #1   Title Pt will be independent with final HEP in order to decrease ankle pain and increase strength in order to improve pain-free function at home and work.    Baseline 03/13/2022= no formal HEP in place    Time 12    Period Weeks    Status New    Target Date 06/05/22      PT LONG TERM GOAL #2   Title Pt will improve FOTO to target score of 71 to display perceived improvements in ability to complete ADL's.    Baseline 03/13/2022= 57    Time 12    Period Weeks    Status New    Target Date 06/05/22      PT LONG TERM GOAL #3   Title Pt will decrease worst pain as reported on NPRS by at least 3 points in order to demonstrate clinically significant reduction in ankle/foot pain.    Baseline 03/13/2022= 7/10 at worst    Time 12    Period Weeks    Status New    Target Date 06/05/22      PT LONG TERM GOAL #4   Title Pt will increase right ankle strength of  by at least 1/2 MMT grade in order to demonstrate improvement in strength and function    Baseline 03/13/2022= 2-/5 Right ankle DF/PF/EV/IV    Time 12     Period Weeks    Status New    Target Date 06/05/22      PT LONG TERM GOAL #5   Title Patient will demo 50% improvement in right ankle ROM for improved overall ROM to facilitate more normalised gait  pattern including heel to toe sequencing.    Baseline 03/13/2022: DF= 4; PF= 25 deg; EV= 7 deg; IV= 12 deg    Time 12    Period Weeks    Status New    Target Date 06/05/22                   Plan - 04/17/22 0759     Clinical Impression Statement Patient tolerates progression of standing interventions with use of tilt board and stabilization. She continues to be challenged with inversion. Her swelling has decreased since she has been cleared from using brace with decreased demarcation of shin. Pt will continue to benefit from skilled PT interventions in order to improve LE strength, ankle stability, and her QOL    Examination-Activity Limitations Lift;Squat;Stairs;Stand    Examination-Participation Restrictions Community Activity;Occupation;Yard Work    Stability/Clinical Decision Making Stable/Uncomplicated    Rehab Potential Good    PT Frequency 2x / week    PT Duration 12 weeks    PT Treatment/Interventions ADLs/Self Care Home Management;Cryotherapy;Electrical Stimulation;Moist Heat;Ultrasound;DME Instruction;Gait training;Stair training;Functional mobility training;Therapeutic activities;Therapeutic exercise;Neuromuscular re-education;Orthotic Fit/Training;Balance training;Patient/family education;Manual techniques;Compression bandaging;Scar mobilization;Passive range of motion;Dry needling;Splinting;Taping    PT Next Visit Plan Review and progress LE ROM/strengthening    Consulted and Agree with Plan of Care Patient             Patient will benefit from skilled therapeutic intervention in order to improve the following deficits and impairments:  Abnormal gait, Decreased balance, Decreased endurance, Decreased mobility, Decreased range of motion, Decreased skin integrity,  Decreased strength, Difficulty walking, Hypomobility, Increased edema, Impaired flexibility, Pain  Visit Diagnosis: Difficulty in walking, not elsewhere classified  Muscle weakness (generalized)  Pain in right ankle and joints of right foot     Problem List Patient Active Problem List   Diagnosis Date Noted   Closed right ankle fracture 01/15/2022    Precious Bard, PT, DPT  04/17/2022, 8:00 AM  [place] [facility] MAIN [facility] SERVICES [address] [place], [place], [address] Phone: [phone]   Fax:  [phone]  Name: Daisee Centner MRN: [id number] Date of Birth: 06/24/1981

## 2022-04-23 ENCOUNTER — Ambulatory Visit: Payer: Self-pay

## 2022-04-23 DIAGNOSIS — M6281 Muscle weakness (generalized): Secondary | ICD-10-CM

## 2022-04-23 DIAGNOSIS — R262 Difficulty in walking, not elsewhere classified: Secondary | ICD-10-CM

## 2022-04-23 DIAGNOSIS — M25571 Pain in right ankle and joints of right foot: Secondary | ICD-10-CM

## 2022-04-23 NOTE — Therapy (Signed)
[facility] MAIN [facility] SERVICES [address] [place], [place], [address] Phone: [phone]   Fax:  [phone]  Physical Therapy Treatment  Patient Details  Name: Joyce Conley MRN: [id number] Date of Birth: 1981/03/06 Referring Provider (PT): Dr. Cassell Smiles   Encounter Date: 04/23/2022   PT End of Session - 04/23/22 1012     Visit Number 7    Number of Visits 25    Date for PT Re-Evaluation 06/05/22    Authorization Time Period 03/13/2022-06/05/2022    Authorization - Visit Number 7    Progress Note Due on Visit 10    PT Start Time 1015    PT Stop Time 1100    PT Time Calculation (min) 45 min    Equipment Utilized During Treatment Gait belt;Other (comment)   Right ASO brace   Activity Tolerance Patient tolerated treatment well;No increased pain    Behavior During Therapy [facility] for tasks assessed/performed             History reviewed. No pertinent past medical history.  Past Surgical History:  Procedure Laterality Date   ANKLE SURGERY Right 2011   CESAREAN SECTION     x2   ORIF ANKLE FRACTURE Right 01/15/2022   Procedure: OPEN REDUCTION INTERNAL FIXATION (ORIF) ANKLE FRACTURE;  Surgeon: Lyndle Herrlich, MD;  Location: [facility] ORS;  Service: Orthopedics;  Laterality: Right;   TUBAL LIGATION      There were no vitals filed for this visit.   Subjective Assessment - 04/23/22 1018     Subjective Patient reports she did four days straight. Is feeling very sore and swollen.    Pertinent History 41 year old female who sustained a right ankle bimalleolar fracture after falling off step ladder and subsequent ORIF surgery on 01/15/2022. Patient presents with order for PT now with WBAT orders and wearing an ASO brace. Patient reports previous similar injury back in 2010. 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 No other significant past medical history    Limitations  Lifting;Standing;Walking;House hold activities    How long can you sit comfortably? no issues    How long can you stand comfortably? 30 min    How long can you walk comfortably? about a mile    Diagnostic tests 01/15/2022- IMPRESSION:  Bimalleolar fractures of the right ankle with lateral displacement.    Patient Stated Goals Jog again, Improved ROM, Return to previous level of function as able    Currently in Pain? Yes    Pain Score 6     Pain Location Ankle    Pain Orientation Right    Pain Descriptors / Indicators Aching    Pain Type Surgical pain    Pain Onset More than a month ago    Pain Frequency Intermittent                   Manual: Long sitting: Anterograde massage for swelling reduction x 9 minutes PROM progressive df 3x20 seconds, progressive pf 3x20 seconds AP talocrural mobilizations x multiple reps  -no pain/ adverse response to any intervention this session.    TherEx:  Long sitting: 4 way ankle strengthening RTB 10x each direction  toe abduction 10x   big toe extension/flexion 15x  diagonal resistance 15x Diagonal d2 resistance against PT hand 15x    Standing: Sit to stand SLS LLE 10x ; very challenging cues for neutral foot alignment  Seated: Dynadisc: -df/pf 10x -inversion/eversion 10x -clockwise 10x -counterclockwise 10x    Pt educated throughout session about proper posture and technique with exercises. Improved exercise technique, movement at target joints, use of target muscles after min to mod verbal, visual, tactile cues.    Patient's session regressed due to increased swelling and pain. Patient tolerates limited weightbearing interventions well with significant improvement in pain and edema by end of session. Education on proper shoe wear performed again. Pt will continue to benefit from skilled PT interventions in order to improve LE strength, ankle stability, and her QOL                       PT Education -  04/23/22 1012     Education Details exercise technique, body mechanics    Person(s) Educated Patient    Methods Explanation;Demonstration;Tactile cues;Verbal cues    Comprehension Verbalized understanding;Returned demonstration;Verbal cues required;Tactile cues required              PT Short Term Goals - 03/13/22 2332       PT SHORT TERM GOAL #1   Title Pt will be independent with HEP in order to decrease ankle pain and increase strength in order to improve pain-free function at home and work.    Baseline 03/13/2022= No formal HEP in place    Time 6    Period Weeks    Status New    Target Date 04/24/22               PT Long Term Goals - 03/13/22 2333       PT LONG TERM GOAL #1   Title Pt will be independent with final HEP in order to decrease ankle pain and increase strength in order to improve pain-free function at home and work.    Baseline 03/13/2022= no formal HEP in place    Time 12    Period Weeks    Status New    Target Date 06/05/22      PT LONG TERM GOAL #2   Title Pt will improve FOTO to target score of 71 to display perceived improvements in ability to complete ADL's.    Baseline 03/13/2022= 57    Time 12    Period Weeks    Status New    Target Date 06/05/22      PT LONG TERM GOAL #3   Title Pt will decrease worst pain as reported on NPRS by at least 3 points in order to demonstrate clinically significant reduction in ankle/foot pain.    Baseline 03/13/2022= 7/10 at worst    Time 12    Period Weeks    Status New    Target Date 06/05/22      PT LONG TERM GOAL #4   Title Pt will increase right ankle strength of  by at least 1/2 MMT grade in order to demonstrate improvement in strength and function    Baseline 03/13/2022= 2-/5 Right ankle DF/PF/EV/IV    Time 12    Period Weeks    Status New    Target Date 06/05/22      PT LONG TERM GOAL #5   Title Patient will demo 50% improvement in right ankle ROM for improved overall ROM to facilitate more  normalised gait pattern including heel to toe sequencing.    Baseline 03/13/2022: DF= 4; PF= 25 deg; EV= 7 deg; IV= 12 deg    Time 12    Period Weeks  Status New    Target Date 06/05/22                   Plan - 04/23/22 1647     Clinical Impression Statement Patient's session regressed due to increased swelling and pain. Patient tolerates limited weightbearing interventions well with significant improvement in pain and edema by end of session. Education on proper shoe wear performed again. Pt will continue to benefit from skilled PT interventions in order to improve LE strength, ankle stability, and her QOL    Examination-Activity Limitations Lift;Squat;Stairs;Stand    Examination-Participation Restrictions Community Activity;Occupation;Yard Work    Stability/Clinical Decision Making Stable/Uncomplicated    Rehab Potential Good    PT Frequency 2x / week    PT Duration 12 weeks    PT Treatment/Interventions ADLs/Self Care Home Management;Cryotherapy;Electrical Stimulation;Moist Heat;Ultrasound;DME Instruction;Gait training;Stair training;Functional mobility training;Therapeutic activities;Therapeutic exercise;Neuromuscular re-education;Orthotic Fit/Training;Balance training;Patient/family education;Manual techniques;Compression bandaging;Scar mobilization;Passive range of motion;Dry needling;Splinting;Taping    PT Next Visit Plan Review and progress LE ROM/strengthening    Consulted and Agree with Plan of Care Patient             Patient will benefit from skilled therapeutic intervention in order to improve the following deficits and impairments:  Abnormal gait, Decreased balance, Decreased endurance, Decreased mobility, Decreased range of motion, Decreased skin integrity, Decreased strength, Difficulty walking, Hypomobility, Increased edema, Impaired flexibility, Pain  Visit Diagnosis: Difficulty in walking, not elsewhere classified  Muscle weakness (generalized)  Pain in  right ankle and joints of right foot     Problem List Patient Active Problem List   Diagnosis Date Noted   Closed right ankle fracture 01/15/2022    Precious Bard, PT, DPT  04/23/2022, 4:47 PM  [place] [facility] MAIN [facility] SERVICES [address] [place], [place], [address] Phone: [phone]   Fax:  [phone]  Name: Jerrika Ledlow MRN: [id number] Date of Birth: 03-31-1981

## 2022-04-27 ENCOUNTER — Ambulatory Visit: Payer: Self-pay

## 2022-04-27 DIAGNOSIS — M6281 Muscle weakness (generalized): Secondary | ICD-10-CM

## 2022-04-27 DIAGNOSIS — R262 Difficulty in walking, not elsewhere classified: Secondary | ICD-10-CM

## 2022-04-27 DIAGNOSIS — M25571 Pain in right ankle and joints of right foot: Secondary | ICD-10-CM

## 2022-04-27 DIAGNOSIS — Z9889 Other specified postprocedural states: Secondary | ICD-10-CM

## 2022-04-30 ENCOUNTER — Ambulatory Visit: Payer: Self-pay

## 2022-05-02 ENCOUNTER — Ambulatory Visit: Payer: Self-pay

## 2022-05-02 DIAGNOSIS — M25571 Pain in right ankle and joints of right foot: Secondary | ICD-10-CM

## 2022-05-02 DIAGNOSIS — Z9889 Other specified postprocedural states: Secondary | ICD-10-CM

## 2022-05-02 DIAGNOSIS — M6281 Muscle weakness (generalized): Secondary | ICD-10-CM

## 2022-05-02 DIAGNOSIS — R262 Difficulty in walking, not elsewhere classified: Secondary | ICD-10-CM

## 2022-05-02 NOTE — Therapy (Signed)
OUTPATIENT PHYSICAL THERAPY TREATMENT NOTE   Patient Name: Joyce Conley MRN: [id number] DOB:05-18-1981, 41 y.o., female Today's Date: 05/02/2022  PCP: none REFERRING PROVIDER: Dr. Cassell Smiles    PT End of Session - 05/02/22 1334     Visit Number 9    Number of Visits 25    Date for PT Re-Evaluation 06/05/22    Authorization Time Period 03/13/2022-06/05/2022    Authorization - Visit Number 7    Progress Note Due on Visit 10    PT Start Time 1338    PT Stop Time 1410    PT Time Calculation (min) 32 min    Equipment Utilized During Treatment Gait belt;Other (comment)   Right ASO brace   Activity Tolerance Patient tolerated treatment well;No increased pain    Behavior During Therapy [facility] for tasks assessed/performed             History reviewed. No pertinent past medical history. Past Surgical History:  Procedure Laterality Date   ANKLE SURGERY Right 2011   CESAREAN SECTION     x2   ORIF ANKLE FRACTURE Right 01/15/2022   Procedure: OPEN REDUCTION INTERNAL FIXATION (ORIF) ANKLE FRACTURE;  Surgeon: Lyndle Herrlich, MD;  Location: [facility] ORS;  Service: Orthopedics;  Laterality: Right;   TUBAL LIGATION     Patient Active Problem List   Diagnosis Date Noted   Closed right ankle fracture 01/15/2022    REFERRING DIAG: Closed right ankle fracture   THERAPY DIAG:  Difficulty in walking, not elsewhere classified  Muscle weakness (generalized)  Pain in right ankle and joints of right foot  Status post open reduction with internal fixation (ORIF) of fracture of ankle  Rationale for Evaluation and Treatment Rehabilitation  PERTINENT HISTORY: 41 year old female who sustained a right ankle bimalleolar fracture after falling off step ladder and subsequent ORIF surgery on 01/15/2022. Patient presents with order for PT now with WBAT orders and wearing an ASO brace. Patient reports previous similar injury back in 2010. 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 No other significant past medical history   PRECAUTIONS: fall  SUBJECTIVE:   PAIN:  Are you having pain? No     TODAY'S TREATMENT:      TherEx:   Seated: Right LE 4 way ankle strengthening RTB  2 sets x 15 reps each direction  Toe yoga- Big toe extension while other 4 toes flexed x 10 reps Toe yoga- Big toe flexion while other 4 toes ext x 6 reps (Patient stopped due to fatigue)  Towel scrunches x 15   Dynadisc: -df/pf 20x -inversion/eversion 20x -clockwise 20x -counterclockwise 20x      Standing: Standing calf raises - hold 2 sec x 15 reps BLE  Standing toe raises- hold 2 sec (heels on 1/2 foam for more ROM)  x 15 reps Staggered standing on airex pad with A/P weight shift x 15 reps each.     Manual: Long sitting: right LE Anterograde massage for swelling reduction x 15 minutes PROM progressive df 3x20 seconds, progressive pf 3x20 seconds AP talocrural grade 2-3 mobilizations x multiple reps  -no pain/ adverse response to any intervention this session.     PATIENT EDUCATION: Education details: exercise tech Person educated: Patient Education method: Explanation, Demonstration, Tactile cues, Verbal cues, and Handouts Education comprehension: verbalized understanding, returned demonstration, verbal cues required, tactile cues required, and needs further education   HOME EXERCISE PROGRAM: No updates this  visit:   04/27/2022 Access Code: [email or web address] URL: [email or web address] Date: 04/27/2022 Prepared by: Maureen Ralphs  Exercises - Long Sitting Ankle Inversion with Resistance  - 1 x daily - 3 x weekly - 3 sets - 10 reps - Seated Ankle Dorsiflexion with Resistance  - 3 x weekly - 3 sets - 10 reps - Long Sitting Ankle Eversion with Resistance  - 1 x daily - 3 x weekly - 3 sets - 10 reps - Seated Ankle Eversion with Resistance  - 3 x weekly - 3 sets - 10 reps - Towel Scrunches  - 1 x daily - 3 x weekly - 3  sets - 10 reps - Toe Yoga - Alternating Great Toe and Lesser Toe Extension  - 1 x daily - 3 x weekly - 3 sets - 10 reps  Pt educated throughout session about proper posture and technique with exercises. Improved exercise technique, movement at target joints, use of target muscles after min to mod verbal, visual, tactile cues.     PT Short Term Goals -       PT SHORT TERM GOAL #1   Title Pt will be independent with HEP in order to decrease ankle pain and increase strength in order to improve pain-free function at home and work.    Baseline 03/13/2022= No formal HEP in place    Time 6    Period Weeks    Status New    Target Date 04/24/22              PT Long Term Goals -      PT LONG TERM GOAL #1   Title Pt will be independent with final HEP in order to decrease ankle pain and increase strength in order to improve pain-free function at home and work.    Baseline 03/13/2022= no formal HEP in place    Time 12    Period Weeks    Status New    Target Date 06/05/22      PT LONG TERM GOAL #2   Title Pt will improve FOTO to target score of 71 to display perceived improvements in ability to complete ADL's.    Baseline 03/13/2022= 57    Time 12    Period Weeks    Status New    Target Date 06/05/22      PT LONG TERM GOAL #3   Title Pt will decrease worst pain as reported on NPRS by at least 3 points in order to demonstrate clinically significant reduction in ankle/foot pain.    Baseline 03/13/2022= 7/10 at worst    Time 12    Period Weeks    Status New    Target Date 06/05/22      PT LONG TERM GOAL #4   Title Pt will increase right ankle strength of  by at least 1/2 MMT grade in order to demonstrate improvement in strength and function    Baseline 03/13/2022= 2-/5 Right ankle DF/PF/EV/IV    Time 12    Period Weeks    Status New    Target Date 06/05/22      PT LONG TERM GOAL #5   Title Patient will demo 50% improvement in right ankle ROM for improved overall ROM to facilitate more  normalised gait pattern including heel to toe sequencing.    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 She did respond well to all manual therapy without report of pain. She performed well and continues to progress with ankle strengthening with increased reps and progression of exercises including more weight bearing without any ill effects. Pt will continue to benefit from skilled PT interventions in order to improve LE strength, ankle stability, and her QOL    Examination-Activity Limitations Lift;Squat;Stairs;Stand    Examination-Participation Restrictions Community Activity;Occupation;Yard Work    Stability/Clinical Decision Making Stable/Uncomplicated    Rehab Potential Good    PT Frequency 2x / week    PT Duration 12 weeks    PT Treatment/Interventions ADLs/Self Care Home Management;Cryotherapy;Electrical Stimulation;Moist Heat;Ultrasound;DME Instruction;Gait training;Stair training;Functional mobility training;Therapeutic activities;Therapeutic exercise;Neuromuscular re-education;Orthotic Fit/Training;Balance training;Patient/family education;Manual techniques;Compression bandaging;Scar mobilization;Passive range of motion;Dry needling;Splinting;Taping    PT Next Visit Plan Review and progress LE ROM/strengthening    Consulted and Agree with Plan of Care Patient               Lenda Kelp, PT 05/02/2022, 2:33 PM

## 2022-05-04 ENCOUNTER — Ambulatory Visit: Payer: Self-pay

## 2022-05-04 DIAGNOSIS — M25571 Pain in right ankle and joints of right foot: Secondary | ICD-10-CM

## 2022-05-04 DIAGNOSIS — R262 Difficulty in walking, not elsewhere classified: Secondary | ICD-10-CM

## 2022-05-04 DIAGNOSIS — M6281 Muscle weakness (generalized): Secondary | ICD-10-CM

## 2022-05-04 DIAGNOSIS — Z8781 Personal history of (healed) traumatic fracture: Secondary | ICD-10-CM

## 2022-05-04 NOTE — Therapy (Addendum)
OUTPATIENT PHYSICAL THERAPY TREATMENT NOTE Physical Therapy Progress Note   Dates of reporting period  03/13/22   to   05/04/22    Patient Name: Joyce Conley MRN: [id number] DOB:Apr 10, 1981, 41 y.o., female Today's Date: 05/04/2022  PCP: none REFERRING PROVIDER: Dr. Kurtis Bushman    PT End of Session - 05/04/22 0836     Visit Number 10    Number of Visits 25    Date for PT Re-Evaluation 06/05/22    Authorization Type Self Pay    Authorization Time Period 03/13/2022-06/05/2022    Progress Note Due on Visit 20    PT Start Time 0802    PT Stop Time 0830   pt needs to leave early to get to work   PT Time Calculation (min) 28 min    Activity Tolerance Patient tolerated treatment well;No increased pain    Behavior During Therapy [facility] for tasks assessed/performed             History reviewed. No pertinent past medical history. Past Surgical History:  Procedure Laterality Date   ANKLE SURGERY Right 2011   CESAREAN SECTION     x2   ORIF ANKLE FRACTURE Right 01/15/2022   Procedure: OPEN REDUCTION INTERNAL FIXATION (ORIF) ANKLE FRACTURE;  Surgeon: Lovell Sheehan, MD;  Location: [facility] ORS;  Service: Orthopedics;  Laterality: Right;   TUBAL LIGATION     Patient Active Problem List   Diagnosis Date Noted   Closed right ankle fracture 01/15/2022    REFERRING DIAG: Closed right ankle fracture   THERAPY DIAG:  Difficulty in walking, not elsewhere classified  Muscle weakness (generalized)  Pain in right ankle and joints of right foot  Status post open reduction with internal fixation (ORIF) of fracture of ankle  Rationale for Evaluation and Treatment Rehabilitation  PERTINENT HISTORY: 41 year old female who sustained a right ankle bimalleolar fracture after falling off step ladder and subsequent ORIF surgery on 01/15/2022. Patient presents with order for PT now with WBAT orders and wearing an ASO brace. Patient reports previous similar injury back in 2010. 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 No other significant past medical history   PRECAUTIONS: None   Subjective Assessment - 05/04/22 0803     Subjective Pt reports her ankle is swollen pretty bad today. She has been on her feet a lot at work and recently pushmowed her lawn. She i sno longer using the ASO brace as it was causing more fluid accumulation just above brace. Pt reports HEP updates from last session are understood and easy to perform.    Pertinent History 41 year old female who sustained a right ankle bimalleolar fracture after falling off step ladder and subsequent ORIF surgery on 01/15/2022. Patient presents with order for PT now with WBAT orders and wearing an ASO brace. Patient reports previous similar injury back in 2010. 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 No other significant past medical history    Diagnostic tests No post-surgical imaging studies performed.    Patient Stated Goals Jog again, Improved ROM, Return to previous level of function as able    Currently in Pain? Yes    Pain Score 6     Pain Location Ankle    Pain Orientation Right               TODAY'S TREATMENT:  Intervention  this date: -FOTO survey -Rt ankle ROM assessment P/ROM, A/ROM -Rt ankle MMT assessment  -standing heel raises assessment: Left side requires considerable BUE assist for >12x, Unable to perform 1 heel raise on Rt.  -Seated heel slides, education on targeting talocrural dorsiflexion rather than mid foot pronation compensation.  -education and use of graded compression garment for work *measurements for compression garment: ankle circumference 31.5, calf circumference 41.5cm, calf to heel: 43cm      Intervention 05/02/22 TherEx:  Seated: Right LE 4 way ankle strengthening RTB  2 sets x 15 reps each direction  Toe yoga- Big toe extension while other 4 toes flexed x 10 reps Toe yoga-  Big toe flexion while other 4 toes ext x 6 reps (Patient stopped due to fatigue)  Towel scrunches x 15   Dynadisc: -df/pf 20x -inversion/eversion 20x -clockwise 20x -counterclockwise 20x      Standing: Standing calf raises - hold 2 sec x 15 reps BLE  Standing toe raises- hold 2 sec (heels on 1/2 foam for more ROM)  x 15 reps Staggered standing on airex pad with A/P weight shift x 15 reps each.     Manual: Long sitting: right LE Anterograde massage for swelling reduction x 15 minutes PROM progressive df 3x20 seconds, progressive pf 3x20 seconds AP talocrural grade 2-3 mobilizations x multiple reps  -no pain/ adverse response to any intervention this session.     PATIENT EDUCATION: Education details: exercise tech Person educated: Patient Education method: Explanation, Demonstration, Tactile cues, Verbal cues, and Handouts Education comprehension: verbalized understanding, returned demonstration, verbal cues required, tactile cues required, and needs further education   HOME EXERCISE PROGRAM: No updates this visit:   04/27/2022 Access Code: [email or web address] URL: [email or web address] Date: 04/27/2022 Prepared by: Sande Brothers  Exercises - Long Sitting Ankle Inversion with Resistance  - 1 x daily - 3 x weekly - 3 sets - 10 reps - Seated Ankle Dorsiflexion with Resistance  - 3 x weekly - 3 sets - 10 reps - Long Sitting Ankle Eversion with Resistance  - 1 x daily - 3 x weekly - 3 sets - 10 reps - Seated Ankle Eversion with Resistance  - 3 x weekly - 3 sets - 10 reps - Towel Scrunches  - 1 x daily - 3 x weekly - 3 sets - 10 reps - Toe Yoga - Alternating Great Toe and Lesser Toe Extension  - 1 x daily - 3 x weekly - 3 sets - 10 reps  Pt educated throughout session about proper posture and technique with exercises. Improved exercise technique, movement at target joints, use of target muscles after min to mod verbal, visual, tactile cues.     PT Short Term Goals -  05/04/22 0841       PT SHORT TERM GOAL #1   Title Pt will be independent with HEP in order to decrease ankle pain and increase strength in order to improve pain-free function at home and work.    Baseline 03/13/2022= No formal HEP in place; 6/30: achieved    Time 6    Period Weeks    Status Achieved    Target Date 04/24/22              PT Long Term Goals - 05/04/22 0842       PT LONG TERM GOAL #1   Title Pt will be independent with final HEP in order to decrease ankle pain and increase strength in order to improve pain-free function at home and  work.    Baseline 03/13/2022= no formal HEP in place; 6/30: recently updated, but not to final program    Time 12    Period Weeks    Status On-going    Target Date 06/05/22      PT LONG TERM GOAL #2   Title Pt will improve FOTO to target score of 71 to display perceived improvements in ability to complete ADL's.    Baseline 03/13/2022: not done at eval; 6/30: 59    Time 12    Period Weeks    Status On-going    Target Date 06/05/22      PT LONG TERM GOAL #3   Title Pt will decrease worst pain as reported on NPRS by at least 3 points in order to demonstrate clinically significant reduction in ankle/foot pain.    Baseline 03/13/2022= 7/10 at worst; 6/30: assessment pending    Time 12    Period Weeks    Status On-going    Target Date 06/05/22      PT LONG TERM GOAL #4   Title Pt will increase right ankle strength of  by at least 1/2 MMT grade in order to demonstrate improvement in strength and function    Baseline 03/13/2022= 2-/5 Right ankle DF/PF/EV/IV; 05/04/22: Rt ankle IV 5/5, EV 4+/5, DF 5/5, PF 5/5;    Time 12    Period Weeks    Status Partially Met    Target Date 06/05/22      PT LONG TERM GOAL #5   Title Patient will demo 50% improvement in right ankle ROM for improved overall ROM to facilitate more normalised gait pattern including heel to toe sequencing.    Baseline 03/13/2022: DF= 4; PF= 25 deg; EV= 7 deg; IV= 12 deg; 05/04/22:  Rt ankle ROM PF 39 degress, IV 24, EV 20, DF A/ROM: 8, DF P/ROM: 10    Time 12    Period Weeks    Status On-going    Target Date 06/05/22               Plan - 05/04/22 6606     Clinical Impression Statement Reassessment today showing good progress toward goal sof treatment overall. Today is visit 10 for patient about 8 weeks after starting PT here. ROM improving and only moderately limited compared to typical ranges. MMT goals achieved, but CKC assessment of calf strength reveals inablity to perform any single leg heel raises on the surgical leg. Pt educated on use of 20-44mHg compression for work days and measurements are taken. Pt encouraged to conintue with HEP as given.    Personal Factors and Comorbidities Age    Examination-Activity Limitations Lift;Squat;Stairs;Stand    Examination-Participation Restrictions Community Activity;Occupation;Yard Work    Stability/Clinical Decision Making Stable/Uncomplicated    CDesigner, [email or web address]    Rehab Potential Fair    PT Frequency 2x / week    PT Duration 12 weeks    PT Treatment/Interventions ADLs/Self Care Home Management;Cryotherapy;Electrical Stimulation;Moist Heat;Ultrasound;DME Instruction;Gait training;Stair training;Functional mobility training;Therapeutic activities;Therapeutic exercise;Neuromuscular re-education;Orthotic Fit/Training;Balance training;Patient/family education;Manual techniques;Compression bandaging;Scar mobilization;Passive range of motion;Dry needling;Splinting;Taping    PT Next Visit Plan Progress LE ROM/strengthening CKC calf strength, proprioceptive assessment,    PT Home Exercise Plan updated on 6/23    Consulted and Agree with Plan of Care Patient               10:37 AM, 05/04/22 AEtta Grandchild PT, DPT Physical Therapist - [facility]  [place] [phone]      Alvaretta Eisenberger C, PT 05/04/2022, 10:37 AM

## 2022-05-10 ENCOUNTER — Ambulatory Visit: Payer: Self-pay

## 2022-05-16 ENCOUNTER — Ambulatory Visit: Payer: Self-pay

## 2022-05-17 ENCOUNTER — Ambulatory Visit: Payer: Self-pay | Attending: Orthopedic Surgery

## 2022-05-17 DIAGNOSIS — M6281 Muscle weakness (generalized): Secondary | ICD-10-CM | POA: Insufficient documentation

## 2022-05-17 DIAGNOSIS — Z9889 Other specified postprocedural states: Secondary | ICD-10-CM | POA: Insufficient documentation

## 2022-05-17 DIAGNOSIS — M25571 Pain in right ankle and joints of right foot: Secondary | ICD-10-CM | POA: Insufficient documentation

## 2022-05-17 DIAGNOSIS — R262 Difficulty in walking, not elsewhere classified: Secondary | ICD-10-CM | POA: Insufficient documentation

## 2022-05-17 DIAGNOSIS — Z8781 Personal history of (healed) traumatic fracture: Secondary | ICD-10-CM | POA: Insufficient documentation

## 2022-05-17 NOTE — Therapy (Signed)
OUTPATIENT PHYSICAL THERAPY TREATMENT NOTE    Patient Name: Joyce Conley MRN: [id number] DOB:11-23-1980, 41 y.o., female Today's Date: 05/18/2022  PCP: none REFERRING PROVIDER: Dr. Kurtis Bushman    PT End of Session - 05/17/22 1300     Visit Number 11    Number of Visits 25    Date for PT Re-Evaluation 06/05/22    Authorization Type Self Pay    Authorization Time Period 03/13/2022-06/05/2022    Progress Note Due on Visit 20    PT Start Time 1300    PT Stop Time 1341    PT Time Calculation (min) 41 min    Activity Tolerance Patient tolerated treatment well;No increased pain    Behavior During Therapy [facility] for tasks assessed/performed             History reviewed. No pertinent past medical history. Past Surgical History:  Procedure Laterality Date   ANKLE SURGERY Right 2011   CESAREAN SECTION     x2   ORIF ANKLE FRACTURE Right 01/15/2022   Procedure: OPEN REDUCTION INTERNAL FIXATION (ORIF) ANKLE FRACTURE;  Surgeon: Lovell Sheehan, MD;  Location: [facility] ORS;  Service: Orthopedics;  Laterality: Right;   TUBAL LIGATION     Patient Active Problem List   Diagnosis Date Noted   Closed right ankle fracture 01/15/2022    REFERRING DIAG: Closed right ankle fracture   THERAPY DIAG:  Difficulty in walking, not elsewhere classified  Muscle weakness (generalized)  Pain in right ankle and joints of right foot  Status post open reduction with internal fixation (ORIF) of fracture of ankle  Rationale for Evaluation and Treatment Rehabilitation  PERTINENT HISTORY: 41 year old female who sustained a right ankle bimalleolar fracture after falling off step ladder and subsequent ORIF surgery on 01/15/2022. Patient presents with order for PT now with WBAT orders and wearing an ASO brace. Patient reports previous similar injury back in 2010. 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 No other significant past medical history    PRECAUTIONS: None   SUBJECTIVE: Patient reports doing well- wearing water shoes as she states they are more comfortable. Reports less swelling overall.  Pain: none     TODAY'S TREATMENT:  Intervention this date:    TherEx:  4 way ankle strengthening GTB  2 sets x 15 reps each direction  Toe yoga- Big toe extension while other 4 toes flexed x 10 reps- Patient with difficulty but able to achieve with practice. Toe yoga- Big toe flexion while other 4 toes ext x 6 reps - More difficulty and stopped at 6 due to unable to perform correctly today.  Towel scrunches x 15   Dynadisc: -df/pf 20x -inversion/eversion 20x -clockwise 20x -counterclockwise 20x        Manual: Long sitting: right LE Anterograde massage for swelling reduction x  PROM DF, PF, IV, EV-3x20 seconds AP talocrural grade 2-3 mobilizations x multiple reps  -no pain/ adverse response to any intervention this session.     PATIENT EDUCATION: Education details: exercise technique Person educated: Patient Education method: Explanation, Demonstration, Tactile cues, Verbal cues, and Handouts Education comprehension: verbalized understanding, returned demonstration, verbal cues required, tactile cues required, and needs further education   HOME EXERCISE PROGRAM: No updates this visit:   04/27/2022 Access Code: [email or web address] URL: [email or web address] Date: 04/27/2022 Prepared by: Sande Brothers  Exercises - Long Sitting Ankle Inversion with Resistance  - 1 x daily - 3 x weekly -  3 sets - 10 reps - Seated Ankle Dorsiflexion with Resistance  - 3 x weekly - 3 sets - 10 reps - Long Sitting Ankle Eversion with Resistance  - 1 x daily - 3 x weekly - 3 sets - 10 reps - Seated Ankle Eversion with Resistance  - 3 x weekly - 3 sets - 10 reps - Towel Scrunches  - 1 x daily - 3 x weekly - 3 sets - 10 reps - Toe Yoga - Alternating Great Toe and Lesser Toe Extension  - 1 x daily - 3 x weekly - 3 sets - 10  reps  Pt educated throughout session about proper posture and technique with exercises. Improved exercise technique, movement at target joints, use of target muscles after min to mod verbal, visual, tactile cues.     PT Short Term Goals - 05/04/22 0841       PT SHORT TERM GOAL #1   Title Pt will be independent with HEP in order to decrease ankle pain and increase strength in order to improve pain-free function at home and work.    Baseline 03/13/2022= No formal HEP in place; 6/30: achieved    Time 6    Period Weeks    Status Achieved    Target Date 04/24/22              PT Long Term Goals - 05/04/22 0842       PT LONG TERM GOAL #1   Title Pt will be independent with final HEP in order to decrease ankle pain and increase strength in order to improve pain-free function at home and work.    Baseline 03/13/2022= no formal HEP in place; 6/30: recently updated, but not to final program    Time 12    Period Weeks    Status On-going    Target Date 06/05/22      PT LONG TERM GOAL #2   Title Pt will improve FOTO to target score of 71 to display perceived improvements in ability to complete ADL's.    Baseline 03/13/2022: not done at eval; 6/30: 59    Time 12    Period Weeks    Status On-going    Target Date 06/05/22      PT LONG TERM GOAL #3   Title Pt will decrease worst pain as reported on NPRS by at least 3 points in order to demonstrate clinically significant reduction in ankle/foot pain.    Baseline 03/13/2022= 7/10 at worst; 6/30: assessment pending    Time 12    Period Weeks    Status On-going    Target Date 06/05/22      PT LONG TERM GOAL #4   Title Pt will increase right ankle strength of  by at least 1/2 MMT grade in order to demonstrate improvement in strength and function    Baseline 03/13/2022= 2-/5 Right ankle DF/PF/EV/IV; 05/04/22: Rt ankle IV 5/5, EV 4+/5, DF 5/5, PF 5/5;    Time 12    Period Weeks    Status Partially Met    Target Date 06/05/22      PT LONG TERM  GOAL #5   Title Patient will demo 50% improvement in right ankle ROM for improved overall ROM to facilitate more normalised gait pattern including heel to toe sequencing.    Baseline 03/13/2022: DF= 4; PF= 25 deg; EV= 7 deg; IV= 12 deg; 05/04/22: Rt ankle ROM PF 39 degress, IV 24, EV 20, DF A/ROM: 8, DF P/ROM: 10  Time 12    Period Weeks    Status On-going    Target Date 06/05/22               Plan - 05/04/22 1540     Clinical Impression Statement Patient presented with improved LE strength as seen by increased resistance today. She also presented with decreased swelling and improved joint mobility with firm end feel with PROM. No pain throughout session. Standing therex deferred as patient was doing well with minimal swelling but did review daily weight shifting/weight bearing activities with HEP. Pt will continue to benefit from skilled PT interventions in order to improve LE strength, ankle stability, and her quality of life.    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[employer]    Rehab Potential Fair    PT Frequency 2x / week    PT Duration 12 weeks    PT Treatment/Interventions ADLs/Self Care Home Management;Cryotherapy;Electrical Stimulation;Moist Heat;Ultrasound;DME Instruction;Gait training;Stair training;Functional mobility training;Therapeutic activities;Therapeutic exercise;Neuromuscular re-education;Orthotic Fit/Training;Balance training;Patient/family education;Manual techniques;Compression bandaging;Scar mobilization;Passive range of motion;Dry needling;Splinting;Taping    PT Next Visit Plan Progress LE ROM/strengthening CKC calf strength, proprioceptive assessment,    PT Home Exercise Plan updated on 6/23    Consulted and Agree with Plan of Care Patient                   Lewis Moccasin, PT 05/18/2022, 7:28 AM

## 2022-05-18 ENCOUNTER — Ambulatory Visit: Payer: Self-pay

## 2022-05-23 ENCOUNTER — Ambulatory Visit: Payer: Self-pay

## 2022-05-29 ENCOUNTER — Ambulatory Visit: Payer: Self-pay

## 2022-05-30 ENCOUNTER — Ambulatory Visit: Payer: Self-pay

## 2022-05-30 DIAGNOSIS — M6281 Muscle weakness (generalized): Secondary | ICD-10-CM

## 2022-05-30 DIAGNOSIS — M25571 Pain in right ankle and joints of right foot: Secondary | ICD-10-CM

## 2022-05-30 DIAGNOSIS — R262 Difficulty in walking, not elsewhere classified: Secondary | ICD-10-CM

## 2022-05-30 DIAGNOSIS — Z9889 Other specified postprocedural states: Secondary | ICD-10-CM

## 2022-05-30 NOTE — Therapy (Signed)
OUTPATIENT PHYSICAL THERAPY TREATMENT NOTE    Patient Name: Joyce Conley MRN: [id number] DOB:[birth year], 41 y.o., female Today's Date: 05/30/2022  PCP: none REFERRING PROVIDER: Dr. Kurtis Bushman    PT End of Session - 05/30/22 1144     Visit Number 12    Number of Visits 25    Date for PT Re-Evaluation 06/05/22    Authorization Type Self Pay    Authorization Time Period 03/13/2022-06/05/2022    Progress Note Due on Visit 20    PT Start Time 1145    PT Stop Time 1220    PT Time Calculation (min) 35 min    Equipment Utilized During Treatment Gait belt    Activity Tolerance Patient tolerated treatment well;No increased pain    Behavior During Therapy [facility] for tasks assessed/performed             History reviewed. No pertinent past medical history. Past Surgical History:  Procedure Laterality Date   ANKLE SURGERY Right 2011   CESAREAN SECTION     x2   ORIF ANKLE FRACTURE Right 01/15/2022   Procedure: OPEN REDUCTION INTERNAL FIXATION (ORIF) ANKLE FRACTURE;  Surgeon: Lovell Sheehan, MD;  Location: [facility] ORS;  Service: Orthopedics;  Laterality: Right;   TUBAL LIGATION     Patient Active Problem List   Diagnosis Date Noted   Closed right ankle fracture 01/15/2022    REFERRING DIAG: Closed right ankle fracture   THERAPY DIAG:  Difficulty in walking, not elsewhere classified  Muscle weakness (generalized)  Pain in right ankle and joints of right foot  Status post open reduction with internal fixation (ORIF) of fracture of ankle  Rationale for Evaluation and Treatment Rehabilitation  PERTINENT HISTORY: 41 year old female who sustained a right ankle bimalleolar fracture after falling off step ladder and subsequent ORIF surgery on 01/15/2022. Patient presents with order for PT now with WBAT orders and wearing an ASO brace. Patient reports previous similar injury back in 2010. 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  No other significant past medical history   PRECAUTIONS: None   SUBJECTIVE: Patient reports having a good vacation but reports increased swelling due to increased time on right foot.   Pain: none     TODAY'S TREATMENT:  Intervention this date:     - Measured ankle swelling- 1 in above ankle = 29.2 cm and 24.0 cm  TherEx:  Standing calf raises x 20 reps with BUE Support Standing tandem on firm surface- hold 30 sec x 2 sets then switch feet x 30 sec x 2 sets Standing tandem on 1/2 foam (curve side up) - hold 30 sec x 2 each LE Side step across 1/2 foam (curve side up) down and back (length of foam) x 2 -Stand on Bosu ball -ball side down- Static stand x approx 1 min with ankle righting reaction using No UE Support to fingertip touch then progressed to more ant/post weight shift x 20 -25 reps then lateral weight shift x 20-25 reps Bosu ball - ball side up- static stand x 1 min then progressed to A/P weight shift as well as lateral weight shift x 20 each.      Manual: Long sitting: right LE Anterograde massage for swelling reduction x  PROM DF, PF, IV, EV-3x20 seconds AP talocrural grade 2-3 mobilizations x multiple reps  -no pain/ adverse response to any intervention this session.      PATIENT EDUCATION: Education  details: exercise technique Person educated: Patient Education method: Explanation, Demonstration, Tactile cues, Verbal cues, and Handouts Education comprehension: verbalized understanding, returned demonstration, verbal cues required, tactile cues required, and needs further education   HOME EXERCISE PROGRAM: No updates this visit:   04/27/2022 Access Code: [email or web address] URL: [email or web address] Date: 04/27/2022 Prepared by: Sande Brothers  Exercises - Long Sitting Ankle Inversion with Resistance  - 1 x daily - 3 x weekly - 3 sets - 10 reps - Seated Ankle Dorsiflexion with Resistance  - 3 x weekly - 3 sets - 10 reps - Long Sitting Ankle  Eversion with Resistance  - 1 x daily - 3 x weekly - 3 sets - 10 reps - Seated Ankle Eversion with Resistance  - 3 x weekly - 3 sets - 10 reps - Towel Scrunches  - 1 x daily - 3 x weekly - 3 sets - 10 reps - Toe Yoga - Alternating Great Toe and Lesser Toe Extension  - 1 x daily - 3 x weekly - 3 sets - 10 reps  Pt educated throughout session about proper posture and technique with exercises. Improved exercise technique, movement at target joints, use of target muscles after min to mod verbal, visual, tactile cues.     PT Short Term Goals - 05/04/22 0841       PT SHORT TERM GOAL #1   Title Pt will be independent with HEP in order to decrease ankle pain and increase strength in order to improve pain-free function at home and work.    Baseline 03/13/2022= No formal HEP in place; 6/30: achieved    Time 6    Period Weeks    Status Achieved    Target Date 04/24/22              PT Long Term Goals - 05/04/22 0842       PT LONG TERM GOAL #1   Title Pt will be independent with final HEP in order to decrease ankle pain and increase strength in order to improve pain-free function at home and work.    Baseline 03/13/2022= no formal HEP in place; 6/30: recently updated, but not to final program    Time 12    Period Weeks    Status On-going    Target Date 06/05/22      PT LONG TERM GOAL #2   Title Pt will improve FOTO to target score of 71 to display perceived improvements in ability to complete ADL's.    Baseline 03/13/2022: not done at eval; 6/30: 59    Time 12    Period Weeks    Status On-going    Target Date 06/05/22      PT LONG TERM GOAL #3   Title Pt will decrease worst pain as reported on NPRS by at least 3 points in order to demonstrate clinically significant reduction in ankle/foot pain.    Baseline 03/13/2022= 7/10 at worst; 6/30: assessment pending    Time 12    Period Weeks    Status On-going    Target Date 06/05/22      PT LONG TERM GOAL #4   Title Pt will increase right  ankle strength of  by at least 1/2 MMT grade in order to demonstrate improvement in strength and function    Baseline 03/13/2022= 2-/5 Right ankle DF/PF/EV/IV; 05/04/22: Rt ankle IV 5/5, EV 4+/5, DF 5/5, PF 5/5;    Time 12    Period Weeks    Status Partially Met  Target Date 06/05/22      PT LONG TERM GOAL #5   Title Patient will demo 50% improvement in right ankle ROM for improved overall ROM to facilitate more normalised gait pattern including heel to toe sequencing.    Baseline 03/13/2022: DF= 4; PF= 25 deg; EV= 7 deg; IV= 12 deg; 05/04/22: Rt ankle ROM PF 39 degress, IV 24, EV 20, DF A/ROM: 8, DF P/ROM: 10    Time 12    Period Weeks    Status On-going    Target Date 06/05/22               Plan - 05/04/22 4944     Clinical Impression Statement Patient presents with increased swelling today - directly related to increased activity including going to beach. She continues to respond well to manual therapy and performed well using mostly ankle righting reaction strategies with balance activities with no pain reported. Pt will continue to benefit from skilled PT interventions in order to improve LE strength, ankle stability, and her quality of life.    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[employer]    Rehab Potential Fair    PT Frequency 2x / week    PT Duration 12 weeks    PT Treatment/Interventions ADLs/Self Care Home Management;Cryotherapy;Electrical Stimulation;Moist Heat;Ultrasound;DME Instruction;Gait training;Stair training;Functional mobility training;Therapeutic activities;Therapeutic exercise;Neuromuscular re-education;Orthotic Fit/Training;Balance training;Patient/family education;Manual techniques;Compression bandaging;Scar mobilization;Passive range of  motion;Dry needling;Splinting;Taping    PT Next Visit Plan Progress LE ROM/strengthening CKC calf strength, proprioceptive assessment,    PT Home Exercise Plan updated on 6/23    Consulted and Agree with Plan of Care Patient                  Lewis Moccasin, PT 05/30/2022, 4:44 PM

## 2022-05-31 ENCOUNTER — Ambulatory Visit: Payer: Self-pay

## 2022-06-05 ENCOUNTER — Ambulatory Visit: Payer: Self-pay

## 2022-06-08 ENCOUNTER — Ambulatory Visit: Payer: Self-pay | Attending: Orthopedic Surgery

## 2022-06-08 DIAGNOSIS — M6281 Muscle weakness (generalized): Secondary | ICD-10-CM | POA: Insufficient documentation

## 2022-06-08 DIAGNOSIS — Z9889 Other specified postprocedural states: Secondary | ICD-10-CM | POA: Insufficient documentation

## 2022-06-08 DIAGNOSIS — Z8781 Personal history of (healed) traumatic fracture: Secondary | ICD-10-CM | POA: Insufficient documentation

## 2022-06-08 DIAGNOSIS — M25571 Pain in right ankle and joints of right foot: Secondary | ICD-10-CM | POA: Insufficient documentation

## 2022-06-08 DIAGNOSIS — R262 Difficulty in walking, not elsewhere classified: Secondary | ICD-10-CM | POA: Insufficient documentation

## 2022-06-08 NOTE — Therapy (Signed)
OUTPATIENT PHYSICAL THERAPY TREATMENT NOTE/Recertification for dates 06/08/2022- 06/08/2022/ Discharge Summary    Patient Name: Joyce Conley MRN: [id number] DOB:[birth year], 41 y.o., female Today's Date: 06/08/2022  PCP: none REFERRING PROVIDER: Dr. Kurtis Bushman    PT End of Session - 06/08/22 0935     Visit Number 13    Number of Visits 25    Date for PT Re-Evaluation 06/05/22    Authorization Type Self Pay    Authorization Time Period 04/07/6947-03/08/6269; Recert 01/08/92-06/05/8298    Progress Note Due on Visit 20    PT Start Time 0930    PT Stop Time 1000    PT Time Calculation (min) 30 min    Equipment Utilized During Treatment Gait belt    Activity Tolerance Patient tolerated treatment well    Behavior During Therapy [facility] for tasks assessed/performed             No past medical history on file. Past Surgical History:  Procedure Laterality Date   ANKLE SURGERY Right 2011   CESAREAN SECTION     x2   ORIF ANKLE FRACTURE Right 01/15/2022   Procedure: OPEN REDUCTION INTERNAL FIXATION (ORIF) ANKLE FRACTURE;  Surgeon: Lovell Sheehan, MD;  Location: [facility] ORS;  Service: Orthopedics;  Laterality: Right;   TUBAL LIGATION     Patient Active Problem List   Diagnosis Date Noted   Closed right ankle fracture 01/15/2022    REFERRING DIAG: Closed right ankle fracture   THERAPY DIAG:  Difficulty in walking, not elsewhere classified  Muscle weakness (generalized)  Pain in right ankle and joints of right foot  Status post open reduction with internal fixation (ORIF) of fracture of ankle  Rationale for Evaluation and Treatment Rehabilitation  PERTINENT HISTORY: 41 year old female who sustained a right ankle bimalleolar fracture after falling off step ladder and subsequent ORIF surgery on 01/15/2022. Patient presents with order for PT now with WBAT orders and wearing an ASO brace. Patient reports previous similar injury back in 2010. 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 No other significant past medical history   PRECAUTIONS: None   SUBJECTIVE: Patient reports feeling much better overall- swelling down today due to not been working today or yesterday and states she feels she is doing very well   Pain: none current     TODAY'S TREATMENT:  Intervention this date:     Reassessed all goals for today's recert/Discharge visit. See goal section for details.    TherEx:  Verbally reviewed home program- Ankle ROM in long sit; TB resistive exercises, And then patient performed several trials of SLS on right LE and at best able to hold x 10 sec without pain today.     Manual: Long sitting: right LE PROM DF, PF, IV, EV-3x20 seconds AP talocrural grade 2-3 mobilizations x multiple reps  -no pain/ adverse response to any intervention this session.      PATIENT EDUCATION: Education details: exercise technique Person educated: Patient Education method: Explanation, Demonstration, Tactile cues, Verbal cues, and Handouts Education comprehension: verbalized understanding, returned demonstration, verbal cues required, tactile cues required, and needs further education   HOME EXERCISE PROGRAM: No updates this visit:   04/27/2022 Access Code: [email or web address] URL: [email or web address] Date: 04/27/2022 Prepared by: Sande Brothers  Exercises - Long Sitting Ankle Inversion with Resistance  - 1 x daily - 3 x weekly - 3 sets - 10 reps - Seated Ankle Dorsiflexion with Resistance  -  3 x weekly - 3 sets - 10 reps - Long Sitting Ankle Eversion with Resistance  - 1 x daily - 3 x weekly - 3 sets - 10 reps - Seated Ankle Eversion with Resistance  - 3 x weekly - 3 sets - 10 reps - Towel Scrunches  - 1 x daily - 3 x weekly - 3 sets - 10 reps - Toe Yoga - Alternating Great Toe and Lesser Toe Extension  - 1 x daily - 3 x weekly - 3 sets - 10 reps  Pt educated throughout session about proper posture and  technique with exercises. Improved exercise technique, movement at target joints, use of target muscles after min to mod verbal, visual, tactile cues.     PT Short Term Goals - 05/04/22 0841       PT SHORT TERM GOAL #1   Title Pt will be independent with HEP in order to decrease ankle pain and increase strength in order to improve pain-free function at home and work.    Baseline 03/13/2022= No formal HEP in place; 6/30: achieved    Time 6    Period Weeks    Status Achieved    Target Date 04/24/22              PT Long Term Goals - 05/04/22 0842       PT LONG TERM GOAL #1   Title Pt will be independent with final HEP in order to decrease ankle pain and increase strength in order to improve pain-free function at home and work.    Baseline 03/13/2022= no formal HEP in place; 6/30: recently updated, but not to final program; 06/08/2022- Patient able to verbalize current HEP for ROM, Strength and balance and states no questions or concerns.   Time 12    Period Weeks    Status Goal met   Target Date 06/08/22      PT LONG TERM GOAL #2   Title Pt will improve FOTO to target score of 71 to display perceived improvements in ability to complete ADL's.    Baseline 03/13/2022: not done at eval; 6/30: 59; 06/08/2022 =79   Time 12    Period Weeks    Status GOAL Met   Target Date 06/08/22      PT LONG TERM GOAL #3   Title Pt will decrease worst pain as reported on NPRS by at least 3 points in order to demonstrate clinically significant reduction in ankle/foot pain.    Baseline 03/13/2022= 7/10 at worst; 6/30: assessment pending; 06/08/2022= Current 0/10; 6/10 at worst- less frequent   Time 12    Period Weeks    Status Partially met   Target Date 06/08/22      PT LONG TERM GOAL #4   Title Pt will increase right ankle strength of  by at least 1/2 MMT grade in order to demonstrate improvement in strength and function    Baseline 03/13/2022= 2-/5 Right ankle DF/PF/EV/IV; 05/04/22: Rt ankle IV 5/5, EV  4+/5, DF 5/5, PF 5/5; 06/08/2022= 5/5 throughout right ankle- DF/PF/EV/IV   Time 12    Period Weeks    Status Goal Met    Target Date 06/08/22      PT LONG TERM GOAL #5   Title Patient will demo 50% improvement in right ankle ROM for improved overall ROM to facilitate more normalised gait pattern including heel to toe sequencing.    Baseline 03/13/2022: DF= 4; PF= 25 deg; EV= 7 deg; IV= 12 deg;  05/04/22: Rt ankle ROM PF 39 degress, IV 24, EV 20, DF A/ROM: 8, DF P/ROM: 10; 06/08/2022= Right PF= 52 deg; DF= 14 deg; IV= 40; EV= 20 deg   Time 12    Period Weeks    Status  GOAL MET   Target Date 06/08/22               Plan - 05/04/22 3546     Clinical Impression Statement Patient presents today for recert/discharge due to missed visit earlier this week. Overall she has progressed very well - demonstrating much improved right ankle ROM, normal muscle strength, and improved mobility, swelling. She has met all goals including FOTO self perceived ability, improved ROM/strengthening and no current pain. She verbalized good understanding of HEP for ROM/strength/balance and knowledgeable to how to treat swelling. She has met all goals and appropriate for discharge.     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[employer]    Rehab Potential Fair    PT Frequency 1 visit   PT Duration 1 week   PT Treatment/Interventions ADLs/Self Care Home Management;Cryotherapy;Electrical Stimulation;Moist Heat;Ultrasound;DME Instruction;Gait training;Stair training;Functional mobility training;Therapeutic activities;Therapeutic exercise;Neuromuscular re-education;Orthotic Fit/Training;Balance training;Patient/family education;Manual techniques;Compression bandaging;Scar mobilization;Passive range of  motion;Dry needling;Splinting;Taping    PT Next Visit Plan Discharge today with goals met   PT Home Exercise Plan updated on 6/23    Consulted and Agree with Plan of Care Patient                  Lewis Moccasin, PT 06/08/2022, 11:25 AM

## 2022-06-13 ENCOUNTER — Ambulatory Visit: Payer: Self-pay

## 2022-11-28 ENCOUNTER — Ambulatory Visit
Admission: RE | Admit: 2022-11-28 | Discharge: 2022-11-28 | Disposition: A | Discharge status: Home / Self Care | Payer: 59 | Source: Ambulatory Visit | Attending: Urgent Care | Admitting: Urgent Care

## 2022-11-28 ENCOUNTER — Other Ambulatory Visit: Payer: Self-pay

## 2022-11-28 VITALS — BP 126/69 | HR 99 | Temp 97.7°F | Resp 17

## 2022-11-28 DIAGNOSIS — B9689 Other specified bacterial agents as the cause of diseases classified elsewhere: Secondary | ICD-10-CM | POA: Diagnosis not present

## 2022-11-28 DIAGNOSIS — J209 Acute bronchitis, unspecified: Secondary | ICD-10-CM | POA: Diagnosis not present

## 2022-11-28 DIAGNOSIS — J019 Acute sinusitis, unspecified: Secondary | ICD-10-CM | POA: Diagnosis not present

## 2022-11-28 MED ORDER — AMOXICILLIN-POT CLAVULANATE 875-125 MG PO TABS
1.0000 | ORAL_TABLET | Freq: Two times a day (BID) | ORAL | 0 refills | Status: AC
  Filled 2022-11-28: qty 20, 10d supply, fill #0

## 2022-11-28 MED ORDER — PREDNISONE 20 MG PO TABS
60.0000 mg | ORAL_TABLET | Freq: Every day | ORAL | 0 refills | Status: AC
  Filled 2022-11-28: qty 15, 5d supply, fill #0

## 2022-11-28 NOTE — Discharge Instructions (Signed)
Follow up here or with your primary care provider if your symptoms are worsening or not improving with treatment.     

## 2022-11-28 NOTE — ED Provider Notes (Signed)
[facility]    CSN: [id number] Arrival date & time: 11/28/22  1310      History   Chief Complaint Chief Complaint  Patient presents with   Cough    Wheezing, sore throat, headache - Entered by patient   Nasal Congestion    HPI Joyce Conley is a 42 y.o. female.    Cough   Presents to urgent care with complaint of cough, head congestion, nasal congestion, chest congestion x 1 month.  Patient states she got better but symptoms have returned.  She denies any previous treatment of her symptoms other than OTC medications.  History reviewed. No pertinent past medical history.  Patient Active Problem List   Diagnosis Date Noted   Closed right ankle fracture 01/15/2022    Past Surgical History:  Procedure Laterality Date   ANKLE SURGERY Right 2011   CESAREAN SECTION     x2   ORIF ANKLE FRACTURE Right 01/15/2022   Procedure: OPEN REDUCTION INTERNAL FIXATION (ORIF) ANKLE FRACTURE;  Surgeon: Lovell Sheehan, MD;  Location: [facility] ORS;  Service: Orthopedics;  Laterality: Right;   TUBAL LIGATION      OB History   No obstetric history on file.      Home Medications    Prior to Admission medications   Medication Sig Start Date End Date Taking? Authorizing Provider  acetaminophen (TYLENOL) 325 MG tablet Take 650 mg by mouth every 6 (six) hours as needed.    [provider]  aspirin EC 81 MG tablet Take 1 tablet (81 mg total) by mouth daily. Swallow whole. 01/16/22   Carlynn Spry, PA-C  cephALEXin (KEFLEX) 500 MG capsule Take 1 capsule every 6 hours by oral route for 5 days. 02/27/22     docusate sodium (COLACE) 100 MG capsule Take 1 capsule (100 mg total) by mouth 2 (two) times daily. 01/16/22   Carlynn Spry, PA-C  ibuprofen (ADVIL) 200 MG tablet Take 200 mg by mouth every 6 (six) hours as needed.    [provider]  methocarbamol (ROBAXIN) 500 MG tablet Take 1 tablet (500 mg total) by mouth every 6 (six) hours as needed for muscle spasms.  01/16/22   Carlynn Spry, PA-C  oxyCODONE (OXY IR/ROXICODONE) 5 MG immediate release tablet Take 1 tablet (5 mg total) by mouth every 4 (four) hours as needed for moderate pain. 01/16/22   Carlynn Spry, PA-C    Family History History reviewed. No pertinent family history.  Social History Social History   Tobacco Use   Smoking status: Some Days    Packs/day: 0.25    Types: Cigarettes   Smokeless tobacco: Never  Substance Use Topics   Alcohol use: Yes    Comment: Occasional   Drug use: Never     Allergies   Patient has no known allergies.   Review of Systems Review of Systems  Respiratory:  Positive for cough.      Physical Exam Triage Vital Signs ED Triage Vitals  Enc Vitals Group     BP 11/28/22 1324 126/69     Pulse Rate 11/28/22 1324 99     Resp 11/28/22 1324 17     Temp 11/28/22 1324 97.7 F (36.5 C)     Temp src --      SpO2 11/28/22 1324 97 %     Weight --      Height --      Head Circumference --      Peak Flow --  Pain Score 11/28/22 1325 0     Pain Loc --      Pain Edu? --      Excl. in [place]? --    No data found.  Updated Vital Signs BP 126/69   Pulse 99   Temp 97.7 F (36.5 C)   Resp 17   LMP 11/26/2022 (Approximate)   SpO2 97%   Visual Acuity Right Eye Distance:   Left Eye Distance:   Bilateral Distance:    Right Eye Near:   Left Eye Near:    Bilateral Near:     Physical Exam Vitals reviewed.  Constitutional:      Appearance: Normal appearance.  Cardiovascular:     Rate and Rhythm: Normal rate and regular rhythm.     Pulses: Normal pulses.     Heart sounds: Normal heart sounds.  Pulmonary:     Effort: Pulmonary effort is normal.     Breath sounds: Decreased air movement present. Wheezing present.  Skin:    General: Skin is warm and dry.  Neurological:     General: No focal deficit present.     Mental Status: She is alert and oriented to person, place, and time.  Psychiatric:        Mood and Affect: Mood normal.         Behavior: Behavior normal.      UC Treatments / Results  Labs (all labs ordered are listed, but only abnormal results are displayed) Labs Reviewed - No data to display  EKG   Radiology No results found.  Procedures Procedures (including critical care time)  Medications Ordered in UC Medications - No data to display  Initial Impression / Assessment and Plan / UC Course  I have reviewed the triage vital signs and the nursing notes.  Pertinent labs & imaging results that were available during my care of the patient were reviewed by me and considered in my medical decision making (see chart for details).   Patient is afebrile here without recent antipyretics. Satting well on room air. Overall is ill appearing, well hydrated, without respiratory distress. Pulmonary exam is remarkable for decreased breath sounds and some wheezing.  She has sinus tenderness.  Symptoms are consistent with a secondary bacterial process and will treat for acute bacterial sinusitis with Augmentin as well as a course of prednisone to reduce her acute sinus inflammation and likely bronchitis.   Final Clinical Impressions(s) / UC Diagnoses   Final diagnoses:  None   Discharge Instructions   None    ED Prescriptions   None    PDMP not reviewed this encounter.   Rose Phi, [place] 11/28/22 1344

## 2022-11-28 NOTE — ED Triage Notes (Signed)
Pt. Presents to UC w/ c/o a cough, head congestion, nasal congestion and chest congestion for the past month.Pt. states she got better and symptoms have returned.

## 2022-12-13 ENCOUNTER — Encounter (INDEPENDENT_AMBULATORY_CARE_PROVIDER_SITE_OTHER): Payer: Self-pay

## 2022-12-17 ENCOUNTER — Encounter: Payer: Self-pay | Admitting: Family Medicine

## 2022-12-17 ENCOUNTER — Ambulatory Visit (INDEPENDENT_AMBULATORY_CARE_PROVIDER_SITE_OTHER): Payer: 59 | Admitting: Family Medicine

## 2022-12-17 ENCOUNTER — Other Ambulatory Visit: Payer: Self-pay

## 2022-12-17 VITALS — BP 138/70 | HR 96 | Temp 98.2°F | Resp 16 | Ht 69.0 in | Wt 255.0 lb

## 2022-12-17 DIAGNOSIS — F32A Depression, unspecified: Secondary | ICD-10-CM

## 2022-12-17 DIAGNOSIS — R635 Abnormal weight gain: Secondary | ICD-10-CM | POA: Diagnosis not present

## 2022-12-17 DIAGNOSIS — F419 Anxiety disorder, unspecified: Secondary | ICD-10-CM

## 2022-12-17 DIAGNOSIS — Z23 Encounter for immunization: Secondary | ICD-10-CM | POA: Diagnosis not present

## 2022-12-17 DIAGNOSIS — Z6837 Body mass index (BMI) 37.0-37.9, adult: Secondary | ICD-10-CM | POA: Diagnosis not present

## 2022-12-17 DIAGNOSIS — R5383 Other fatigue: Secondary | ICD-10-CM

## 2022-12-17 LAB — CBC WITH DIFFERENTIAL/PLATELET
Basophils Absolute: 0 10*3/uL (ref 0.0–0.1)
Basophils Relative: 0.2 % (ref 0.0–3.0)
Eosinophils Absolute: 0.3 10*3/uL (ref 0.0–0.7)
Eosinophils Relative: 3.3 % (ref 0.0–5.0)
HCT: 39 % (ref 36.0–46.0)
Hemoglobin: 12.8 g/dL (ref 12.0–15.0)
Lymphocytes Relative: 24.1 % (ref 12.0–46.0)
Lymphs Abs: 1.9 10*3/uL (ref 0.7–4.0)
MCHC: 32.7 g/dL (ref 30.0–36.0)
MCV: 92.8 fl (ref 78.0–100.0)
Monocytes Absolute: 0.6 10*3/uL (ref 0.1–1.0)
Monocytes Relative: 7.9 % (ref 3.0–12.0)
Neutro Abs: 5 10*3/uL (ref 1.4–7.7)
Neutrophils Relative %: 64.5 % (ref 43.0–77.0)
Platelets: 372 10*3/uL (ref 150.0–400.0)
RBC: 4.2 Mil/uL (ref 3.87–5.11)
RDW: 15.2 % (ref 11.5–15.5)
WBC: 7.7 10*3/uL (ref 4.0–10.5)

## 2022-12-17 LAB — COMPREHENSIVE METABOLIC PANEL
ALT: 39 U/L — ABNORMAL HIGH (ref 0–35)
AST: 38 U/L — ABNORMAL HIGH (ref 0–37)
Albumin: 4 g/dL (ref 3.5–5.2)
Alkaline Phosphatase: 83 U/L (ref 39–117)
BUN: 13 mg/dL (ref 6–23)
CO2: 25 mEq/L (ref 19–32)
Calcium: 9.2 mg/dL (ref 8.4–10.5)
Chloride: 105 mEq/L (ref 96–112)
Creatinine, Ser: 0.72 mg/dL (ref 0.40–1.20)
GFR: 102.11 mL/min (ref >60.00)
Glucose, Bld: 90 mg/dL (ref 70–99)
Potassium: 4.3 mEq/L (ref 3.5–5.1)
Sodium: 140 mEq/L (ref 135–145)
Total Bilirubin: 0.3 mg/dL (ref 0.2–1.2)
Total Protein: 6.9 g/dL (ref 6.0–8.3)

## 2022-12-17 LAB — TSH: TSH: 2.2 u[IU]/mL (ref 0.35–5.50)

## 2022-12-17 LAB — LIPID PANEL
Cholesterol: 213 mg/dL — ABNORMAL HIGH (ref 0–200)
HDL: 99.8 mg/dL (ref >39.00)
LDL Cholesterol: 101 mg/dL — ABNORMAL HIGH (ref 0–99)
NonHDL: 113.3
Total CHOL/HDL Ratio: 2
Triglycerides: 61 mg/dL (ref 0.0–149.0)
VLDL: 12.2 mg/dL (ref 0.0–40.0)

## 2022-12-17 MED ORDER — FLUOXETINE HCL 20 MG PO CAPS
20.0000 mg | ORAL_CAPSULE | Freq: Every day | ORAL | 3 refills | Status: DC
  Filled 2022-12-17: qty 30, 30d supply, fill #0
  Filled 2023-01-11 (×2): qty 30, 30d supply, fill #1
  Filled 2023-02-11: qty 30, 30d supply, fill #2
  Filled 2023-03-12: qty 30, 30d supply, fill #3

## 2022-12-17 NOTE — Patient Instructions (Addendum)
Prozac: Taking the medicine as directed and not missing any doses is one of the best things you can do to treat your anxiety/depression.  Here are some things to keep in mind: Side effects (stomach upset, some increased anxiety) may happen before you notice a benefit.  These side effects typically go away over time. Changes to your dose of medicine or a change in medication all together is sometimes necessary Many people will notice an improvement within two weeks but the full effect of the medication can take up to 4-6 weeks Stopping the medication when you start feeling better often results in a return of symptoms. Most people need to be on medication at least 6-12 months If you start having thoughts of hurting yourself or others after starting this medicine, please call me immediately.   Do not drink alcohol while on this medicine.   -------------------------------------------------------------------   Thank you for choosing [facility] at [facility] for your Primary Care needs. I am excited for the opportunity to partner with you to meet your health care goals. It was a pleasure meeting you today!  Information on diet, exercise, and health maintenance recommendations are listed below. This is information to help you be sure you are on track for optimal health and monitoring.   Please look over this and let us know if you have any questions or if you have completed any of the health maintenance outside of [facility] so that we can be sure your records are up to date.  ___________________________________________________________  MyChart:  For all urgent or time sensitive needs we ask that you please call the office to avoid delays. Our number is [phone]. MyChart is not constantly monitored and due to the large volume of messages a day, replies may take up to 72 business hours.  MyChart Policy: MyChart allows for you to see your visit notes, after visit summary,  provider recommendations, lab and tests results, make an appointment, request refills, and contact your provider or the office for non-urgent questions or concerns. Providers are seeing patients during normal business hours and do not have built in time to review MyChart messages.  We ask that you allow a minimum of 3 business days for responses to [employer]. For this reason, please do not send urgent requests through [place]. Please call the office at [phone]. New and ongoing conditions may require a visit. We have virtual and in-person visits available for your convenience.  Complex MyChart concerns may require a visit. Your provider may request you schedule a virtual or in-person visit to ensure we are providing the best care possible. MyChart messages sent after 11:00 AM on Friday will not be received by the provider until Monday morning.    Lab and Test Results: You will receive your lab and test results on MyChart as soon as they are completed and results have been sent by the lab or testing facility. Due to this service, you will receive your results BEFORE your provider.  I review lab and test results each morning prior to seeing patients. Some results require collaboration with other providers to ensure you are receiving the most appropriate care. For this reason, we ask that you please allow a minimum of 3-5 business days from the time that ALL results have been received for your provider to receive and review lab and test results and contact you about these.  Most lab and test result comments from the provider will be sent through [place]. Your  provider may recommend changes to the plan of care, follow-up visits, repeat testing, ask questions, or request an office visit to discuss these results. You may reply directly to this message or call the office to provide information for the provider or set up an appointment. In some instances, you will be called with test results and  recommendations. Please let us know if this is preferred and we will make note of this in your chart to provide this for you.    If you have not heard a response to your lab or test results in 5 business days from all results returning to [facility], please call the office to let us know. We ask that you please avoid calling prior to this time unless there is an emergent concern. Due to high call volumes, this can delay the resulting process.  After Hours: For all non-emergency after hours needs, please call the office at [phone] and select the option to reach the on-call  service. On-call services are shared between multiple [place] offices and therefore it will not be possible to speak directly with your provider. On-call providers may provide medical advice and recommendations, but are unable to provide refills for maintenance medications.  For all emergency or urgent medical needs after normal business hours, we recommend that you seek care at the closest Urgent Care or Emergency Department to ensure appropriate treatment in a timely manner.  [facility] has a 24 hour emergency room located on the ground floor for your convenience.   Urgent Concerns During the Business Day Providers are seeing patients from 8AM to [place] with a busy schedule and are most often not able to respond to non-urgent calls until the end of the day or the next business day. If you should have URGENT concerns during the day, please call and speak to the nurse or schedule a same day appointment so that we can address your concern without delay.   Thank you, again, for choosing me as your health care partner. I appreciate your trust and look forward to learning more about you!   [place]cell [place] Olevia Bowens, DNP, FNP-C  ___________________________________________________________  Health Maintenance Recommendations Screening Testing Mammogram Every 1-2 years based on history and risk factors Starting at age 33 Pap  Smear Ages 21-39 every 3 years Ages 109-65 every 5 years with HPV testing More frequent testing may be required based on results and history Colon Cancer Screening Every 1-10 years based on test performed, risk factors, and history Starting at age 31 Bone Density Screening Every 2-10 years based on history Starting at age 22 for women Recommendations for men differ based on medication usage, history, and risk factors AAA Screening One time ultrasound Men 33-38 years old who have ever smoked Lung Cancer Screening Low Dose Lung CT every 12 months Age 4-80 years with a 20 pack-year smoking history who still smoke or who have quit within the last 15 years  Screening Labs Routine  Labs: Complete Blood Count (CBC), Complete Metabolic Panel (CMP), Cholesterol (Lipid Panel) Every 6-12 months based on history and medications May be recommended more frequently based on current conditions or previous results Hemoglobin A1c Lab Every 3-12 months based on history and previous results Starting at age 76 or earlier with diagnosis of diabetes, high cholesterol, BMI >26, and/or risk factors Frequent monitoring for patients with diabetes to ensure blood sugar control Thyroid Panel  Every 6 months based on history, symptoms, and risk factors May be repeated more often if on  medication HIV One time testing for all patients 13 and older May be repeated more frequently for patients with increased risk factors or exposure Hepatitis C One time testing for all patients 2 and older May be repeated more frequently for patients with increased risk factors or exposure Gonorrhea, Chlamydia Every 12 months for all sexually active persons 13-24 years Additional monitoring may be recommended for those who are considered high risk or who have symptoms PSA Men 84-73 years old with risk factors Additional screening may be recommended from age 46-69 based on risk factors, symptoms, and history  Vaccine  Recommendations Tetanus Booster All adults every 10 years Flu Vaccine All patients 6 months and older every year COVID Vaccine All patients 12 years and older Initial dosing with booster May recommend additional booster based on age and health history HPV Vaccine 2 doses all patients age 13-26 Dosing may be considered for patients over 26 Shingles Vaccine (Shingrix) 2 doses all adults 54 years and older Pneumonia (Pneumovax 65) All adults 15 years and older May recommend earlier dosing based on health history Pneumonia (Prevnar 73) All adults 51 years and older Dosed 1 year after Pneumovax 23 Pneumonia (Prevnar 61) All adults 4 years and older (adults [id number] with certain conditions or risk factors) 1 dose  For those who have not received Prevnar 13 vaccine previously   Additional Screening, Testing, and Vaccinations may be recommended on an individualized basis based on family history, health history, risk factors, and/or exposure.  __________________________________________________________  Diet Recommendations for All Patients  I recommend that all patients maintain a diet low in saturated fats, carbohydrates, and cholesterol. While this can be challenging at first, it is not impossible and small changes can make big differences.  Things to try: Decreasing the amount of soda, sweet tea, and/or juice to one or less per day and replace with water While water is always the first choice, if you do not like water you may consider adding a water additive without sugar to improve the taste other sugar free drinks Replace potatoes with a brightly colored vegetable  Use healthy oils, such as canola oil or olive oil, instead of butter or hard margarine Limit your bread intake to two pieces or less a day Replace regular pasta with low carb pasta options Bake, broil, or grill foods instead of frying Monitor portion sizes  Eat smaller, more frequent meals throughout the day instead of  large meals  An important thing to remember is, if you love foods that are not great for your health, you don't have to give them up completely. Instead, allow these foods to be a reward when you have done well. Allowing yourself to still have special treats every once in a while is a nice way to tell yourself thank you for working hard to keep yourself healthy.   Also remember that every day is a new day. If you have a bad day and "fall off the wagon", you can still climb right back up and keep moving along on your journey!  We have resources available to help you!  Some websites that may be helpful include: www.http://carter.biz/  Www.VeryWellFit.com _____________________________________________________________  Activity Recommendations for All Patients  I recommend that all adults get at least 20 minutes of moderate physical activity that elevates your heart rate at least 5 days out of the week.  Some examples include: Walking or jogging at a pace that allows you to carry on a conversation Cycling (stationary bike or outdoors) Water aerobics  Yoga Weight lifting Dancing If physical limitations prevent you from putting stress on your joints, exercise in a pool or seated in a chair are excellent options.  Do determine your MAXIMUM heart rate for activity: 220 - YOUR AGE = MAX Heart Rate   Remember! Do not push yourself too hard.  Start slowly and build up your pace, speed, weight, time in exercise, etc.  Allow your body to rest between exercise and get good sleep. You will need more water than normal when you are exerting yourself. Do not wait until you are thirsty to drink. Drink with a purpose of getting in at least 8, 8 ounce glasses of water a day plus more depending on how much you exercise and sweat.    If you begin to develop dizziness, chest pain, abdominal pain, jaw pain, shortness of breath, headache, vision changes, lightheadedness, or other concerning symptoms, stop the activity  and allow your body to rest. If your symptoms are severe, seek emergency evaluation immediately. If your symptoms are concerning, but not severe, please let us know so that we can recommend further evaluation.
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
## 2022-12-26 ENCOUNTER — Encounter: Payer: Self-pay | Admitting: Obstetrics & Gynecology

## 2023-01-11 ENCOUNTER — Other Ambulatory Visit: Payer: Self-pay

## 2023-01-17 ENCOUNTER — Encounter: Payer: 59 | Admitting: Radiology

## 2023-01-29 ENCOUNTER — Ambulatory Visit: Payer: 59 | Admitting: Family Medicine

## 2023-02-21 ENCOUNTER — Encounter: Payer: 59 | Admitting: Radiology

## 2023-03-05 ENCOUNTER — Ambulatory Visit: Payer: 59 | Admitting: Family Medicine

## 2023-03-29 ENCOUNTER — Encounter: Payer: Self-pay | Admitting: Emergency Medicine

## 2023-03-29 ENCOUNTER — Emergency Department: Payer: 59

## 2023-03-29 ENCOUNTER — Other Ambulatory Visit: Payer: Self-pay

## 2023-03-29 ENCOUNTER — Emergency Department
Admission: EM | Admit: 2023-03-29 | Discharge: 2023-03-29 | Disposition: A | Discharge status: Home / Self Care | Payer: 59 | Attending: Emergency Medicine | Admitting: Emergency Medicine

## 2023-03-29 DIAGNOSIS — S81012A Laceration without foreign body, left knee, initial encounter: Secondary | ICD-10-CM | POA: Diagnosis not present

## 2023-03-29 DIAGNOSIS — S8992XA Unspecified injury of left lower leg, initial encounter: Secondary | ICD-10-CM | POA: Diagnosis present

## 2023-03-29 DIAGNOSIS — S81022A Laceration with foreign body, left knee, initial encounter: Secondary | ICD-10-CM | POA: Diagnosis not present

## 2023-03-29 MED ORDER — LIDOCAINE-EPINEPHRINE (PF) 2 %-1:200000 IJ SOLN
20.0000 mL | Freq: Once | INTRAMUSCULAR | Status: DC
  Filled 2023-03-29: qty 20

## 2023-03-29 MED ORDER — LIDOCAINE-EPINEPHRINE (PF) 1 %-1:200000 IJ SOLN
30.0000 mL | Freq: Once | INTRAMUSCULAR | Status: AC
  Administered 2023-03-29: 30 mL via INTRADERMAL
  Filled 2023-03-29: qty 30

## 2023-03-29 NOTE — ED Notes (Signed)
ED Provider at bedside. 
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
## 2023-03-29 NOTE — ED Notes (Signed)
Knee immobilizer applied. Warm blankets given. Lights dimmed. Pt states will try to sleep. Call bell in reach.

## 2023-03-29 NOTE — ED Notes (Signed)
Pt resting, RR even and unlabored 

## 2023-03-29 NOTE — ED Notes (Signed)
Pt resting. RR even and unlabored.
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
## 2023-03-29 NOTE — ED Notes (Signed)
L knee site continuing to bleed after closure. Abd dressing with Kerlex and Koban placed to L knee laceration followed by immobilizer. Patient instructed to stay in bed to prevent sutures from opening.

## 2023-03-29 NOTE — ED Notes (Signed)
Patient informed that she would need to produce a driver for d/c. Patient stated she would call a driver. This nurse informed her that we will need to see/verify when they arrive.
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
## 2023-03-29 NOTE — Discharge Instructions (Addendum)
Please use ibuprofen (Motrin) up to 800 mg every 8 hours, naproxen (Naprosyn) up to 500 mg every 12 hours, and/or acetaminophen (Tylenol) up to 4 g/day for any continued pain.  Please do not use this medication regimen for longer than 7 days 

## 2023-04-03 ENCOUNTER — Emergency Department
Admission: EM | Admit: 2023-04-03 | Discharge: 2023-04-03 | Disposition: A | Discharge status: Home / Self Care | Payer: 59 | Attending: Student in an Organized Health Care Education/Training Program | Admitting: Student in an Organized Health Care Education/Training Program

## 2023-04-03 ENCOUNTER — Other Ambulatory Visit: Payer: Self-pay

## 2023-04-03 DIAGNOSIS — Y838 Other surgical procedures as the cause of abnormal reaction of the patient, or of later complication, without mention of misadventure at the time of the procedure: Secondary | ICD-10-CM | POA: Insufficient documentation

## 2023-04-03 DIAGNOSIS — T8140XA Infection following a procedure, unspecified, initial encounter: Secondary | ICD-10-CM | POA: Insufficient documentation

## 2023-04-03 DIAGNOSIS — Z4802 Encounter for removal of sutures: Secondary | ICD-10-CM | POA: Diagnosis not present

## 2023-04-03 DIAGNOSIS — T798XXA Other early complications of trauma, initial encounter: Secondary | ICD-10-CM | POA: Diagnosis not present

## 2023-04-03 DIAGNOSIS — L089 Local infection of the skin and subcutaneous tissue, unspecified: Secondary | ICD-10-CM

## 2023-04-03 MED ORDER — HYDROCODONE-ACETAMINOPHEN 5-325 MG PO TABS
1.0000 | ORAL_TABLET | Freq: Four times a day (QID) | ORAL | 0 refills | Status: AC | PRN
  Filled 2023-04-03: qty 12, 3d supply, fill #0

## 2023-04-03 MED ORDER — CEPHALEXIN 500 MG PO CAPS
500.0000 mg | ORAL_CAPSULE | Freq: Three times a day (TID) | ORAL | 0 refills | Status: AC
  Filled 2023-04-03: qty 30, 10d supply, fill #0
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
## 2023-04-03 NOTE — ED Notes (Signed)
Pt verbalizes understanding of discharge instructions. Opportunity for questioning and answers were provided. Pt discharged from ED to home by self.    
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
## 2023-04-12 ENCOUNTER — Other Ambulatory Visit: Payer: Self-pay

## 2023-04-12 ENCOUNTER — Ambulatory Visit (INDEPENDENT_AMBULATORY_CARE_PROVIDER_SITE_OTHER): Payer: 59 | Admitting: Family Medicine

## 2023-04-12 ENCOUNTER — Encounter: Payer: Self-pay | Admitting: Family Medicine

## 2023-04-12 DIAGNOSIS — F419 Anxiety disorder, unspecified: Secondary | ICD-10-CM | POA: Diagnosis not present

## 2023-04-12 DIAGNOSIS — F32A Depression, unspecified: Secondary | ICD-10-CM

## 2023-04-12 MED ORDER — FLUOXETINE HCL 20 MG PO CAPS
20.0000 mg | ORAL_CAPSULE | Freq: Every day | ORAL | 1 refills | Status: AC
Start: 2023-04-12 — End: ?
  Filled 2023-04-12: qty 90, 90d supply, fill #0
  Filled 2023-07-10: qty 90, 90d supply, fill #1

## 2023-04-12 NOTE — Assessment & Plan Note (Signed)
Well controlled. Continue Prozac 20 mg daily and lifestyle measures No SI/HI
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
## 2023-06-05 ENCOUNTER — Encounter (INDEPENDENT_AMBULATORY_CARE_PROVIDER_SITE_OTHER): Payer: Self-pay

## 2023-06-11 IMAGING — DX DG ANKLE COMPLETE 3+V*R*
3 series · 3 of 3 positions shown · non-contrast
Comparison: None.

CLINICAL DATA: Severe pain and swelling after injury.

EXAM:
RIGHT ANKLE - COMPLETE 3+ VIEW

[ankle ap]
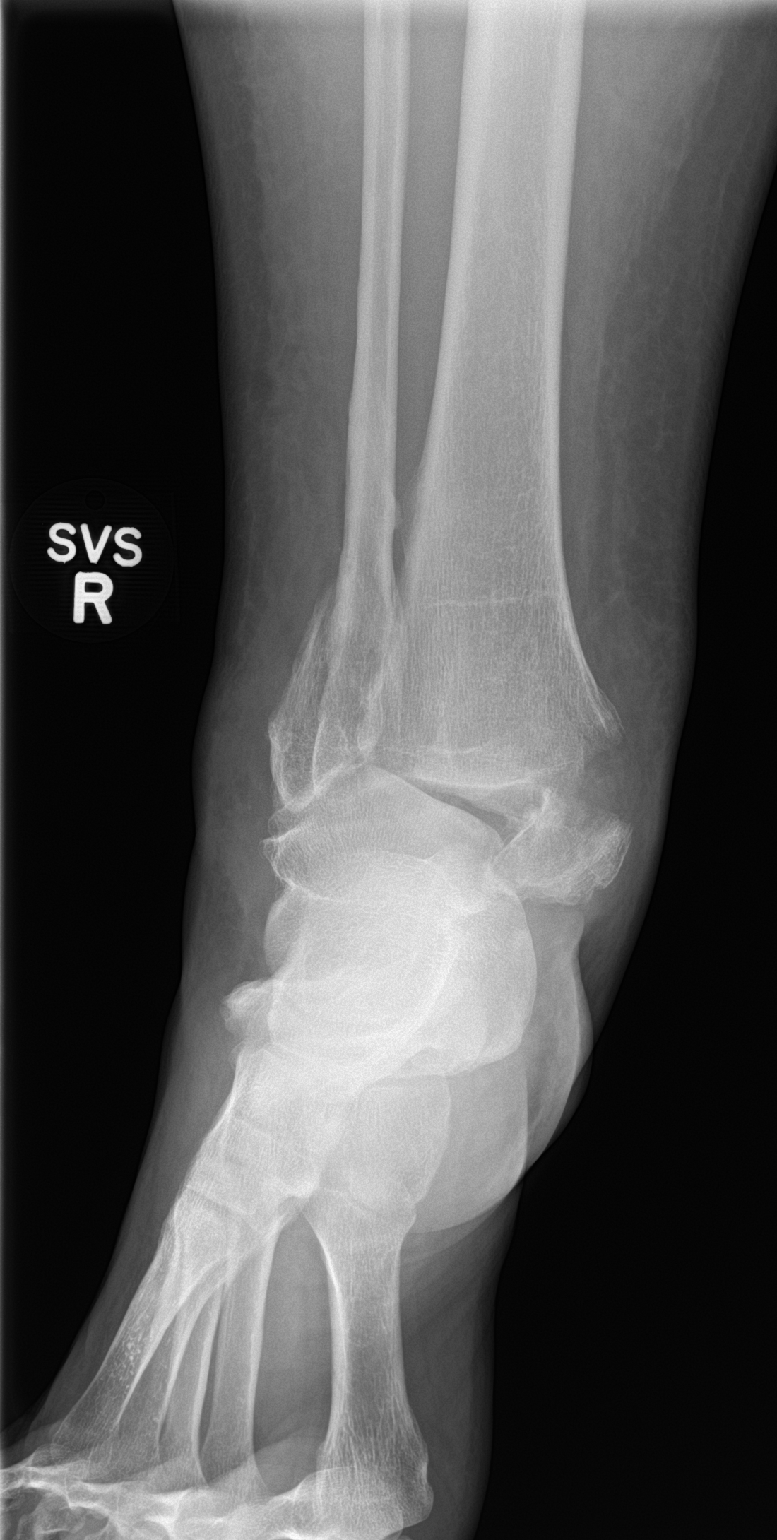

[ankle obl]
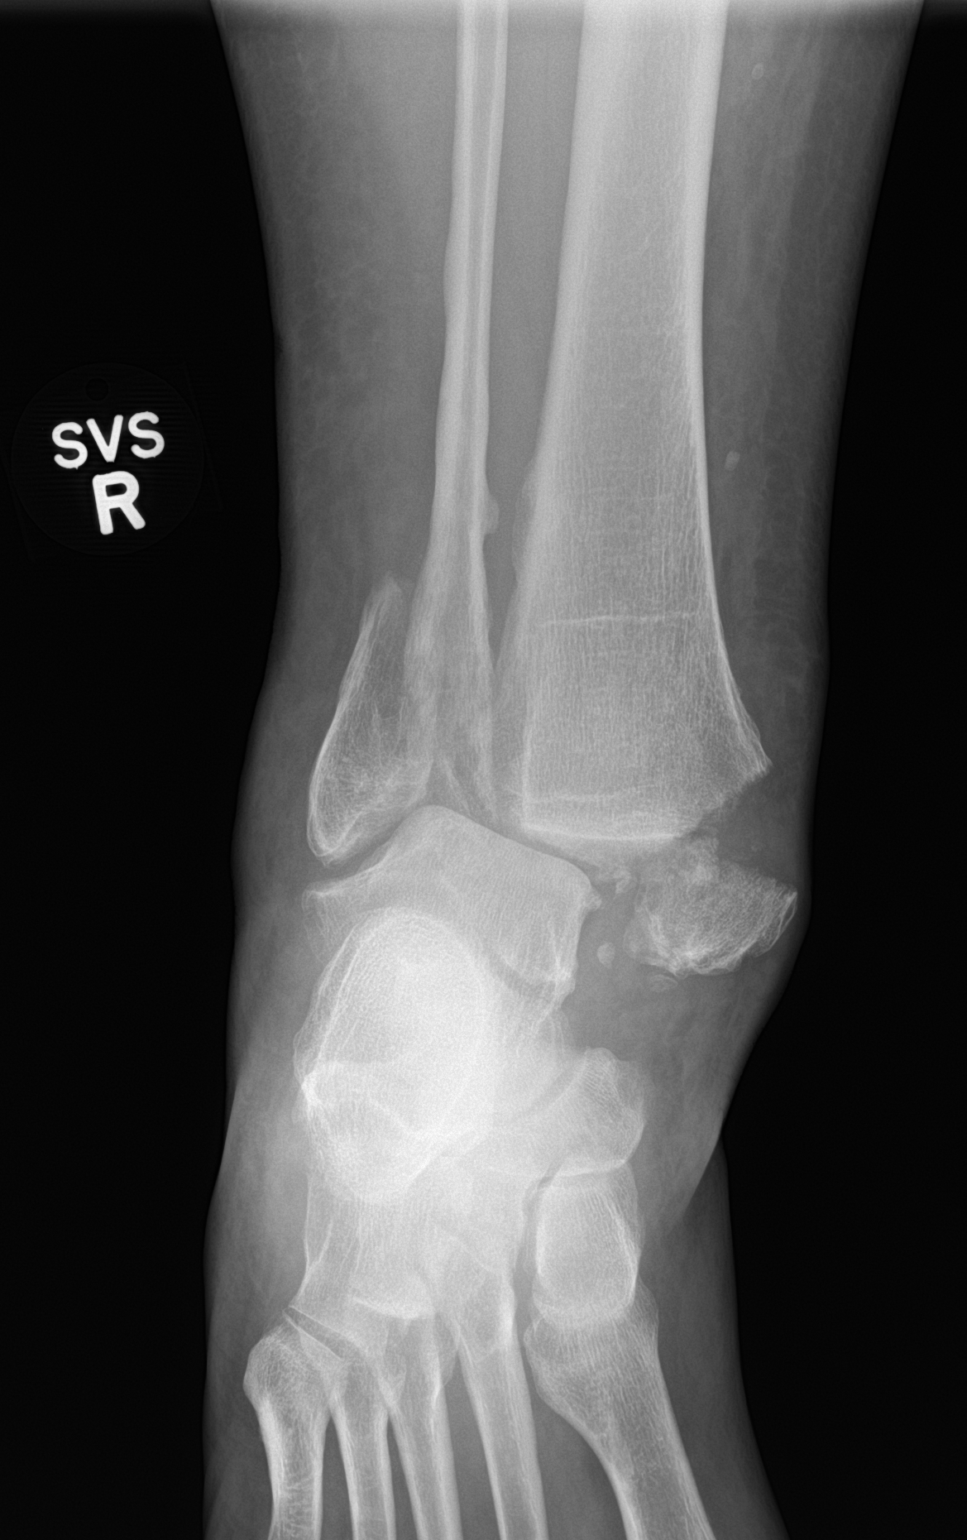

[ankle lat]
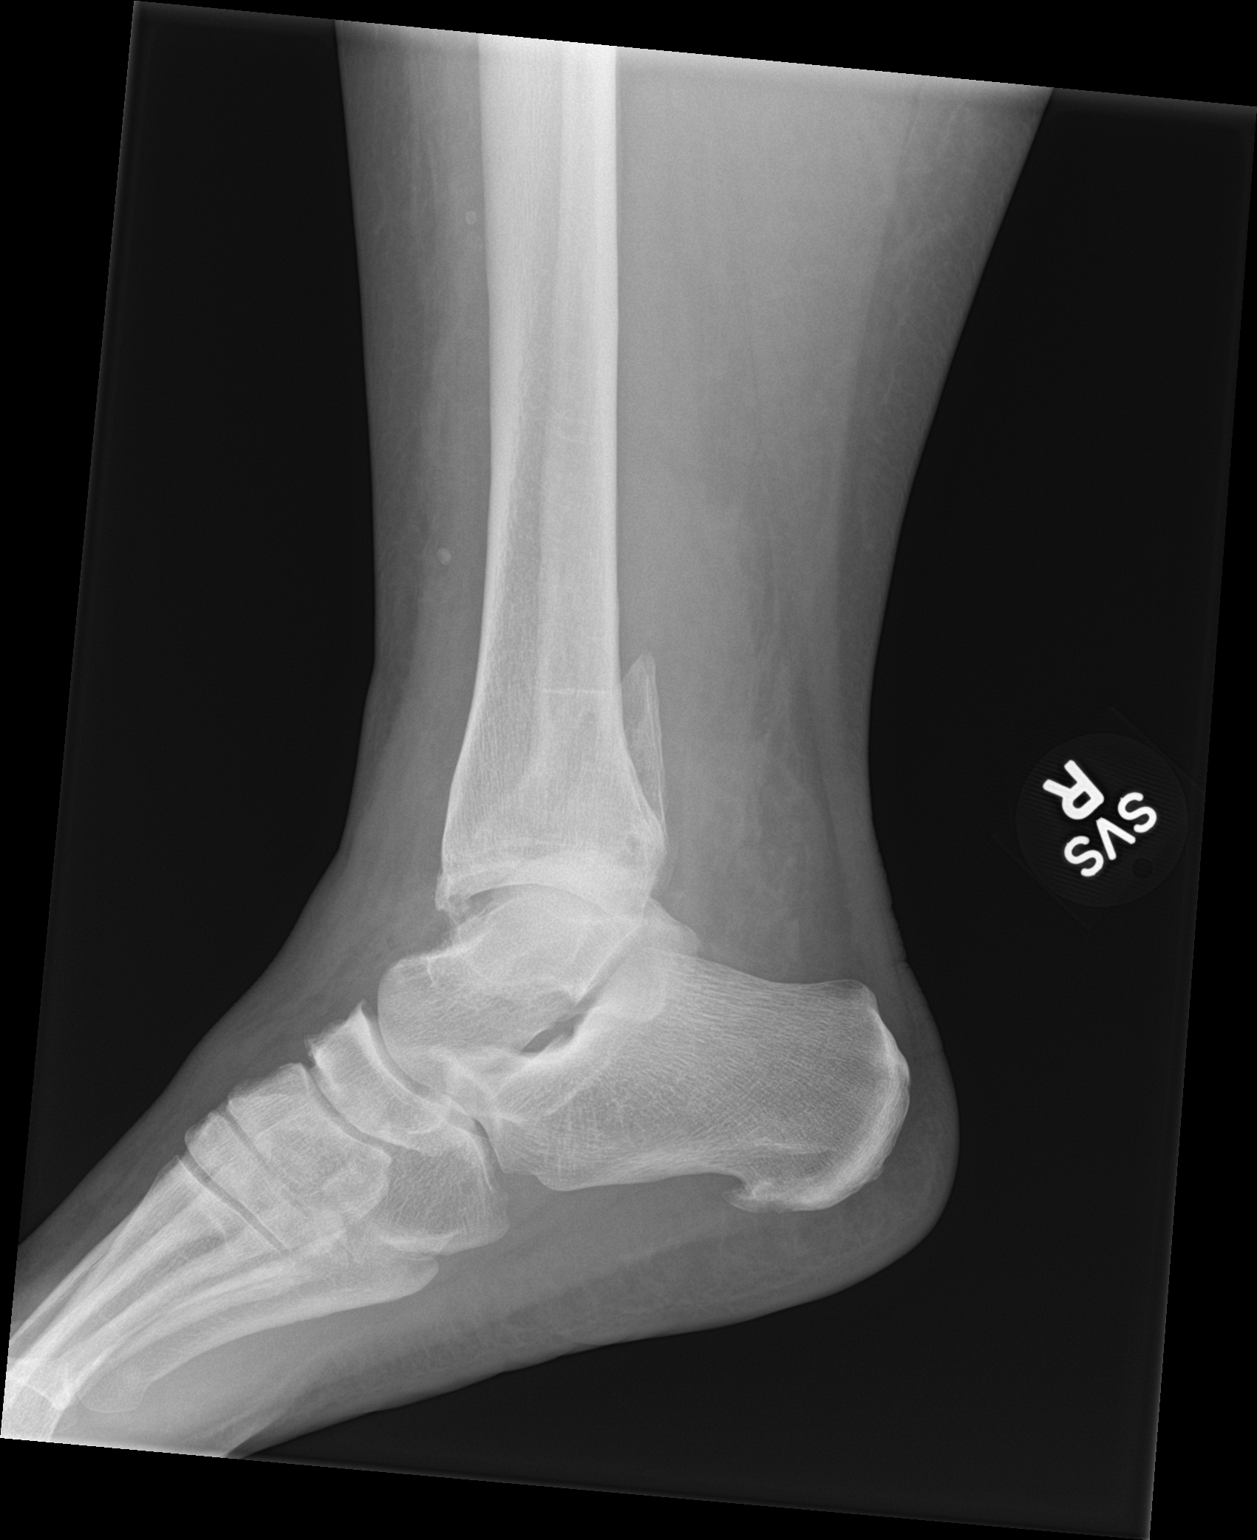

[3 of 3 positions shown; findings below may reference images not displayed]

FINDINGS: Transverse fracture of the medial malleolus with distraction and
lateral displacement of the distal fracture fragment. Small
comminuted fragments are demonstrated. Oblique fracture of the
distal fibula with full shaft with lateral displacement, 2.8 cm
overriding, and mild posterior displacement of the distal fracture
fragments. 2 cm lateral displacement of the talus with respect to
the tibia. Diffuse soft tissue swelling. Degenerative changes in the
intertarsal joints.
IMPRESSION: Bimalleolar fractures of the right ankle with lateral displacement.

## 2023-06-21 ENCOUNTER — Encounter: Payer: Self-pay | Admitting: Family Medicine

## 2023-07-10 ENCOUNTER — Other Ambulatory Visit: Payer: Self-pay

## 2023-07-18 ENCOUNTER — Ambulatory Visit (INDEPENDENT_AMBULATORY_CARE_PROVIDER_SITE_OTHER): Payer: BLUE CROSS/BLUE SHIELD | Admitting: Family Medicine

## 2023-07-18 ENCOUNTER — Encounter: Payer: Self-pay | Admitting: Family Medicine

## 2023-07-18 VITALS — BP 124/80 | HR 77 | Ht 69.0 in | Wt 244.0 lb

## 2023-07-18 DIAGNOSIS — R7989 Other specified abnormal findings of blood chemistry: Secondary | ICD-10-CM | POA: Diagnosis not present

## 2023-07-18 DIAGNOSIS — R197 Diarrhea, unspecified: Secondary | ICD-10-CM | POA: Diagnosis not present

## 2023-07-18 NOTE — Progress Notes (Signed)
Acute Office Visit  Subjective:     Patient ID: Joyce Conley, female    DOB: [birth year], 42 y.o.   MRN: [id number]  Chief Complaint  Patient presents with   Diarrhea    HPI Patient is in today for diarrhea.   Discussed the use of AI scribe software for clinical note transcription with the patient, who gave verbal consent to proceed.  History of Present Illness   The patient presents with a week-long history of gastrointestinal symptoms. The onset of symptoms was sudden, following the consumption of food from a fast-food restaurant. The initial presentation was severe, with chills, nausea, vomiting, and diarrhea. The patient also reported coinciding with her menstrual cycle, which has been notably heavy recently, and often accompanied by flu-like symptoms.  Despite the cessation of acute symptoms, the patient continues to experience diarrhea, which is watery and often sudden, leading to accidents. The frequency of bowel movements is dependent on dietary intake, but occurs at least once to twice daily. Accompanying the diarrhea are abdominal pains, described as 'churny' and 'crampy,' and loud digestive sounds. The discomfort is not severe, but noticeable, and often precedes a bowel movement.  The patient denies any changes in urination, fever, or blood in the stool. She has not taken any medication for the current symptoms. This presentation is unusual for the patient, who typically has regular bowel movements. 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            ROS All review of systems negative except what is listed in the HPI      Objective:    BP 124/80   Pulse 77   Ht 5\' 9"  (1.753 m)   Wt 244 lb (110.7 kg)   SpO2 98%   BMI 36.03 kg/m    Physical Exam Vitals reviewed.  Constitutional:      Appearance: Normal appearance. She is obese.  Cardiovascular:     Rate and Rhythm: Normal rate and regular  rhythm.     Heart sounds: Normal heart sounds.  Pulmonary:     Effort: Pulmonary effort is normal.     Breath sounds: Normal breath sounds.  Abdominal:     General: There is no distension.     Palpations: Abdomen is soft.     Tenderness: There is no abdominal tenderness. There is no guarding.     Comments: Hyperactive   Musculoskeletal:     Cervical back: Normal range of motion and neck supple. No tenderness.  Lymphadenopathy:     Cervical: No cervical adenopathy.  Skin:    General: Skin is warm and dry.  Neurological:     Mental Status: She is alert and oriented to person, place, and time.  Psychiatric:        Mood and Affect: Mood normal.        Behavior: Behavior normal.        Thought Content: Thought content normal.        Judgment: Judgment normal.     No results found for any visits on 07/18/23.      Assessment & Plan:   Problem List Items Addressed This Visit   None Visit Diagnoses     Diarrhea, unspecified type    -  Primary   Relevant Orders   CBC with Differential/Platelet   Comprehensive metabolic panel   Clostridium Difficile by PCR   Stool culture         Acute Diarrhea  Sudden onset of watery diarrhea, nausea, vomiting, and abdominal cramping for one week. No fever or blood in stool. Possible foodborne illness or infectious gastroenteritis. -Collect stool sample  -Complete blood count to assess for dehydration and infection. -Consider trial of over-the-counter Pepto-Bismol or Imodium for symptom management. -If symptoms persist and all tests are negative, consider referral to gastroenterology.        No orders of the defined types were placed in this encounter.   Return if symptoms worsen or fail to improve.  Clayborne Dana, NP

## 2023-07-19 ENCOUNTER — Other Ambulatory Visit: Payer: BLUE CROSS/BLUE SHIELD

## 2023-07-19 DIAGNOSIS — R197 Diarrhea, unspecified: Secondary | ICD-10-CM

## 2023-07-19 LAB — CBC WITH DIFFERENTIAL/PLATELET
Basophils Absolute: 0.1 10*3/uL (ref 0.0–0.1)
Basophils Relative: 0.5 % (ref 0.0–3.0)
Eosinophils Absolute: 0.5 10*3/uL (ref 0.0–0.7)
Eosinophils Relative: 4 % (ref 0.0–5.0)
HCT: 35.1 % — ABNORMAL LOW (ref 36.0–46.0)
Hemoglobin: 10.7 g/dL — ABNORMAL LOW (ref 12.0–15.0)
Lymphocytes Relative: 24.2 % (ref 12.0–46.0)
Lymphs Abs: 2.7 10*3/uL (ref 0.7–4.0)
MCHC: 30.4 g/dL (ref 30.0–36.0)
MCV: 77 fl — ABNORMAL LOW (ref 78.0–100.0)
Monocytes Absolute: 0.6 10*3/uL (ref 0.1–1.0)
Monocytes Relative: 4.9 % (ref 3.0–12.0)
Neutro Abs: 7.5 10*3/uL (ref 1.4–7.7)
Neutrophils Relative %: 66.4 % (ref 43.0–77.0)
Platelets: 705 10*3/uL — ABNORMAL HIGH (ref 150.0–400.0)
RBC: 4.55 Mil/uL (ref 3.87–5.11)
RDW: 16.8 % — ABNORMAL HIGH (ref 11.5–15.5)
WBC: 11.3 10*3/uL — ABNORMAL HIGH (ref 4.0–10.5)

## 2023-07-19 LAB — COMPREHENSIVE METABOLIC PANEL
ALT: 26 U/L (ref 0–35)
AST: 30 U/L (ref 0–37)
Albumin: 3.5 g/dL (ref 3.5–5.2)
Alkaline Phosphatase: 109 U/L (ref 39–117)
BUN: 7 mg/dL (ref 6–23)
CO2: 25 meq/L (ref 19–32)
Calcium: 8.7 mg/dL (ref 8.4–10.5)
Chloride: 101 meq/L (ref 96–112)
Creatinine, Ser: 0.7 mg/dL (ref 0.40–1.20)
GFR: 105.19 mL/min (ref >60.00)
Glucose, Bld: 70 mg/dL (ref 70–99)
Potassium: 3.8 meq/L (ref 3.5–5.1)
Sodium: 136 meq/L (ref 135–145)
Total Bilirubin: 0.2 mg/dL (ref 0.2–1.2)
Total Protein: 6.8 g/dL (ref 6.0–8.3)

## 2023-07-19 NOTE — Addendum Note (Signed)
Addended by: Hyman Hopes B on: 07/19/2023 12:33 PM   Modules accepted: Orders

## 2023-07-19 NOTE — Progress Notes (Signed)
CBC is abnormal, could be reactive to whatever infection may be going on, and mildly anemic. Let's recheck early next week. Please schedule a lab appointment. They will also be giving you an IFOB card to check for blood in stool.   Any severe symptoms, go to the ED.

## 2023-07-19 NOTE — Progress Notes (Signed)
Specimen drop off

## 2023-07-20 LAB — CLOSTRIDIUM DIFFICILE BY PCR: Toxigenic C. Difficile by PCR: NEGATIVE

## 2023-07-22 ENCOUNTER — Other Ambulatory Visit (INDEPENDENT_AMBULATORY_CARE_PROVIDER_SITE_OTHER): Payer: BLUE CROSS/BLUE SHIELD

## 2023-07-22 DIAGNOSIS — E611 Iron deficiency: Secondary | ICD-10-CM

## 2023-07-22 DIAGNOSIS — R7989 Other specified abnormal findings of blood chemistry: Secondary | ICD-10-CM | POA: Diagnosis not present

## 2023-07-22 LAB — IBC + FERRITIN
Ferritin: 11.9 ng/mL (ref 10.0–291.0)
Iron: 23 ug/dL — ABNORMAL LOW (ref 42–145)
Saturation Ratios: 4.7 % — ABNORMAL LOW (ref 20.0–50.0)
TIBC: 494.2 ug/dL — ABNORMAL HIGH (ref 250.0–450.0)
Transferrin: 353 mg/dL (ref 212.0–360.0)

## 2023-07-22 NOTE — Addendum Note (Signed)
Addended by: Mervin Kung A on: 07/22/2023 08:42 AM   Modules accepted: Orders

## 2023-07-23 LAB — CBC WITH DIFFERENTIAL/PLATELET
Absolute Monocytes: 595 {cells}/uL (ref 200–950)
Basophils Absolute: 47 {cells}/uL (ref 0–200)
Basophils Relative: 0.5 %
Eosinophils Absolute: 233 {cells}/uL (ref 15–500)
Eosinophils Relative: 2.5 %
HCT: 36.7 % (ref 35.0–45.0)
Hemoglobin: 11.1 g/dL — ABNORMAL LOW (ref 11.7–15.5)
Lymphs Abs: 2176 {cells}/uL (ref 850–3900)
MCH: 23.7 pg — ABNORMAL LOW (ref 27.0–33.0)
MCHC: 30.2 g/dL — ABNORMAL LOW (ref 32.0–36.0)
MCV: 78.3 fL — ABNORMAL LOW (ref 80.0–100.0)
MPV: 8.8 fL (ref 7.5–12.5)
Monocytes Relative: 6.4 %
Neutro Abs: 6250 {cells}/uL (ref 1500–7800)
Neutrophils Relative %: 67.2 %
Platelets: 605 10*3/uL — ABNORMAL HIGH (ref 140–400)
RBC: 4.69 10*6/uL (ref 3.80–5.10)
RDW: 14.6 % (ref 11.0–15.0)
Total Lymphocyte: 23.4 %
WBC: 9.3 10*3/uL (ref 3.8–10.8)

## 2023-07-23 LAB — PATHOLOGIST SMEAR REVIEW

## 2023-07-24 LAB — STOOL CULTURE: E coli, Shiga toxin Assay: NEGATIVE

## 2023-07-24 NOTE — Addendum Note (Signed)
Addended by: Hyman Hopes B on: 07/24/2023 11:03 AM   Modules accepted: Orders

## 2023-07-24 NOTE — Progress Notes (Signed)
Platelets are still high and iron counts are low. I'm going to refer to hematology because you likely need an iron infusion. In the meantime, start taking Ferrous Sulfate 325 mg (over-the-counter) every other day. Take with Vitamin C to enhance absorption. Take it at least 1-2 hours after tea, coffee, milk, eggs, and other medications.

## 2023-08-08 ENCOUNTER — Other Ambulatory Visit: Payer: Self-pay

## 2023-08-08 ENCOUNTER — Inpatient Hospital Stay: Payer: BLUE CROSS/BLUE SHIELD

## 2023-08-08 ENCOUNTER — Inpatient Hospital Stay: Payer: BLUE CROSS/BLUE SHIELD | Attending: Medical Oncology | Admitting: Medical Oncology

## 2023-08-08 ENCOUNTER — Encounter: Payer: Self-pay | Admitting: Medical Oncology

## 2023-08-08 VITALS — BP 126/56 | HR 68 | Temp 98.2°F | Resp 18 | Ht 69.0 in | Wt 243.8 lb

## 2023-08-08 DIAGNOSIS — F17210 Nicotine dependence, cigarettes, uncomplicated: Secondary | ICD-10-CM | POA: Diagnosis not present

## 2023-08-08 DIAGNOSIS — E611 Iron deficiency: Secondary | ICD-10-CM | POA: Insufficient documentation

## 2023-08-08 DIAGNOSIS — Z801 Family history of malignant neoplasm of trachea, bronchus and lung: Secondary | ICD-10-CM | POA: Insufficient documentation

## 2023-08-08 DIAGNOSIS — Z9189 Other specified personal risk factors, not elsewhere classified: Secondary | ICD-10-CM

## 2023-08-08 DIAGNOSIS — Z800 Family history of malignant neoplasm of digestive organs: Secondary | ICD-10-CM | POA: Diagnosis not present

## 2023-08-08 DIAGNOSIS — N920 Excessive and frequent menstruation with regular cycle: Secondary | ICD-10-CM | POA: Insufficient documentation

## 2023-08-08 DIAGNOSIS — D509 Iron deficiency anemia, unspecified: Secondary | ICD-10-CM

## 2023-08-08 LAB — IRON AND IRON BINDING CAPACITY (CC-WL,HP ONLY)
Iron: 25 ug/dL — ABNORMAL LOW (ref 28–170)
Saturation Ratios: 4 % — ABNORMAL LOW (ref 10.4–31.8)
TIBC: 608 ug/dL — ABNORMAL HIGH (ref 250–450)
UIBC: 583 ug/dL — ABNORMAL HIGH (ref 148–442)

## 2023-08-08 LAB — CMP (CANCER CENTER ONLY)
ALT: 41 U/L (ref 0–44)
AST: 45 U/L — ABNORMAL HIGH (ref 15–41)
Albumin: 4.1 g/dL (ref 3.5–5.0)
Alkaline Phosphatase: 118 U/L (ref 38–126)
Anion gap: 7 (ref 5–15)
BUN: 11 mg/dL (ref 6–20)
CO2: 30 mmol/L (ref 22–32)
Calcium: 9.4 mg/dL (ref 8.9–10.3)
Chloride: 98 mmol/L (ref 98–111)
Creatinine: 0.93 mg/dL (ref 0.44–1.00)
GFR, Estimated: >60 mL/min (ref >60)
Glucose, Bld: 97 mg/dL (ref 70–99)
Potassium: 3.7 mmol/L (ref 3.5–5.1)
Sodium: 135 mmol/L (ref 135–145)
Total Bilirubin: 0.4 mg/dL (ref 0.3–1.2)
Total Protein: 8.1 g/dL (ref 6.5–8.1)

## 2023-08-08 LAB — LIPASE, BLOOD: Lipase: 88 U/L — ABNORMAL HIGH (ref 11–51)

## 2023-08-08 LAB — CBC WITH DIFFERENTIAL (CANCER CENTER ONLY)
Abs Immature Granulocytes: 0.04 10*3/uL (ref 0.00–0.07)
Basophils Absolute: 0.1 10*3/uL (ref 0.0–0.1)
Basophils Relative: 1 %
Eosinophils Absolute: 0.5 10*3/uL (ref 0.0–0.5)
Eosinophils Relative: 7 %
HCT: 36.3 % (ref 36.0–46.0)
Hemoglobin: 11.1 g/dL — ABNORMAL LOW (ref 12.0–15.0)
Immature Granulocytes: 1 %
Lymphocytes Relative: 30 %
Lymphs Abs: 2.2 10*3/uL (ref 0.7–4.0)
MCH: 23.9 pg — ABNORMAL LOW (ref 26.0–34.0)
MCHC: 30.6 g/dL (ref 30.0–36.0)
MCV: 78.1 fL — ABNORMAL LOW (ref 80.0–100.0)
Monocytes Absolute: 0.6 10*3/uL (ref 0.1–1.0)
Monocytes Relative: 8 %
Neutro Abs: 3.9 10*3/uL (ref 1.7–7.7)
Neutrophils Relative %: 53 %
Platelet Count: 527 10*3/uL — ABNORMAL HIGH (ref 150–400)
RBC: 4.65 MIL/uL (ref 3.87–5.11)
RDW: 17.9 % — ABNORMAL HIGH (ref 11.5–15.5)
WBC Count: 7.3 10*3/uL (ref 4.0–10.5)
nRBC: 0 % (ref 0.0–0.2)

## 2023-08-08 LAB — AMYLASE: Amylase: 90 U/L (ref 28–100)

## 2023-08-08 LAB — FOLATE: Folate: 12.8 ng/mL (ref >5.9)

## 2023-08-08 LAB — FERRITIN: Ferritin: 9 ng/mL — ABNORMAL LOW (ref 11–307)

## 2023-08-08 LAB — VITAMIN B12: Vitamin B-12: 2022 pg/mL — ABNORMAL HIGH (ref 180–914)

## 2023-08-08 NOTE — Progress Notes (Signed)
[facility] [phone]   [phone]  INITIAL CONSULT NOTE  Patient Care Team: Clayborne Dana, NP as PCP - General (Family Medicine)  CHIEF COMPLAINTS/PURPOSE OF CONSULTATION:  "Iron Deficiency "  HISTORY OF PRESENTING ILLNESS:  Joyce Conley 42 y.o. female is referred to our office by their PCP NP Reola Calkins for iron deficiency. This IDA is longstanding per patient.   She is a Museum/gallery curator at [employer].   She reports being concerned about her blood abnormalities because "cancer runs in my family". Dad was exposed to agent orange and had pancreatic and lung cancer. Paternal uncle who also had pancreatic cancer was also exposed to agent orange.   Paternal grandmother had stomach cancer- She did not drink ETOH or smoke.    Blood in stools:No Blood in urine: No Difficulty swallowing: No Pica: No SOB: Possibly Fatigue: Yes Change of bowel movement/constipation: Yes- a few weeks ago had GI symptoms of diarrhea and vomiting. Corrected itself about a week ago. Now back to normal.  Prior blood transfusion: No Prior history of blood loss: No Liver disease: No known Bariatric surgery: No UC/Crohn's: No Frequent Blood Donation: No Vaginal bleeding: Fairly regular, heavy periods. Due for well woman exam. Mom went through menopause around 4-59 years of age Prior evaluation with hematology: No Prior bone marrow biopsy: No Oral iron: Started oral iron every other day. In the morning with vitamin C. Started this about 2 weeks ago. Tolerating it ok.  Prior IV iron infusions: No Family history Anemia: No- no known sickle cell or thalassemia  No Personal or family history of bleeding or clotting disorders.  Not on any special diets: No Iron rich foods in diet: No No upcoming planned pregnancies and not currently pregnant: No  She is not UTD on her mammograms She is almost due for her colon cancer screening.   No unintentional weight loss.  She does have  night sweats.  Wt Readings from Last 3 Encounters:  08/08/23 243 lb 12.8 oz (110.6 kg)  07/18/23 244 lb (110.7 kg)  04/12/23 246 lb (111.6 kg)     MEDICAL HISTORY:  Past Medical History:  Diagnosis Date   Anxiety and depression 04/12/2023    SURGICAL HISTORY: Past Surgical History:  Procedure Laterality Date   ANKLE SURGERY Right 2011   CESAREAN SECTION     x2   ORIF ANKLE FRACTURE Right 01/15/2022   Procedure: OPEN REDUCTION INTERNAL FIXATION (ORIF) ANKLE FRACTURE;  Surgeon: Lyndle Herrlich, MD;  Location: [facility] ORS;  Service: Orthopedics;  Laterality: Right;   TUBAL LIGATION      SOCIAL HISTORY: Social History   Socioeconomic History   Marital status: Single    Spouse name: Not on file   Number of children: 2   Years of education: Not on file   Highest education level: Associate degree: occupational, Scientist, product/process development, or vocational program  Occupational History   Not on file  Tobacco Use   Smoking status: Some Days    Types: Cigarettes   Smokeless tobacco: Never  Vaping Use   Vaping status: Never Used  Substance and Sexual Activity   Alcohol use: Yes    Alcohol/week: 12.0 standard drinks of alcohol    Types: 12 Cans of beer per week    Comment: Occasional   Drug use: Never   Sexual activity: Yes    Birth control/protection: None  Other Topics Concern   Not on file  Social History Narrative   Not on file  Social Determinants of Health   Financial Resource Strain: Low Risk  (01/27/2023)   Overall Financial Resource Strain (CARDIA)    Difficulty of Paying Living Expenses: Not very hard  Food Insecurity: No Food Insecurity (08/08/2023)   Hunger Vital Sign    Worried About Running Out of Food in the Last Year: Never true    Ran Out of Food in the Last Year: Never true  Transportation Needs: No Transportation Needs (01/27/2023)   PRAPARE - Administrator, Civil Service (Medical): No    Lack of Transportation (Non-Medical): No  Physical Activity:  Sufficiently Active (01/27/2023)   Exercise Vital Sign    Days of Exercise per Week: 3 days    Minutes of Exercise per Session: 60 min  Stress: Patient Declined (01/27/2023)   [employer] of Occupational Health - Occupational Stress Questionnaire    Feeling of Stress : Patient declined  Social Connections: Moderately Isolated (01/27/2023)   Social Connection and Isolation Panel [NHANES]    Frequency of Communication with Friends and Family: Twice a week    Frequency of Social Gatherings with Friends and Family: More than three times a week    Attends Religious Services: 1 to 4 times per year    Active Member of [employer] or Organizations: No    Attends Engineer, structural: Not on file    Marital Status: Divorced  Intimate Partner Violence: Not At Risk (08/08/2023)   Humiliation, Afraid, Rape, and Kick questionnaire    Fear of Current or Ex-Partner: No    Emotionally Abused: No    Physically Abused: No    Sexually Abused: No    FAMILY HISTORY: Family History  Problem Relation Age of Onset   Cancer Father        Lung, pancreatic   Early death Father    Cancer Paternal Uncle        Prostate, pancreatic   Cancer Paternal Grandmother        stomach cancer   Diabetes Paternal Grandmother    Cancer Paternal Grandfather     ALLERGIES:  has No Known Allergies.  MEDICATIONS:  Current Outpatient Medications  Medication Sig Dispense Refill   FLUoxetine (PROZAC) 20 MG capsule Take 1 capsule (20 mg total) by mouth daily. 90 capsule 1   No current facility-administered medications for this visit.    REVIEW OF SYSTEMS:   Constitutional: ( - ) fevers, ( - )  chills , ( - ) night sweats Eyes: ( - ) blurriness of vision, ( - ) double vision, ( - ) watery eyes Ears, nose, mouth, throat, and face: ( - ) mucositis, ( - ) sore throat Respiratory: ( - ) cough, ( - ) dyspnea, ( - ) wheezes Cardiovascular: ( - ) palpitation, ( - ) chest discomfort, ( - ) lower extremity  swelling Gastrointestinal:  ( - ) nausea, ( - ) heartburn, ( - ) change in bowel habits Skin: ( - ) abnormal skin rashes Lymphatics: ( - ) new lymphadenopathy, ( - ) easy bruising Neurological: ( - ) numbness, ( - ) tingling, ( - ) new weaknesses Behavioral/Psych: ( - ) mood change, ( - ) new changes  All other systems were reviewed with the patient and are negative.  PHYSICAL EXAMINATION: ECOG PERFORMANCE STATUS: 1 - Symptomatic but completely ambulatory  Vitals:   08/08/23 1259  BP: (!) 126/56  Pulse: 68  Resp: 18  Temp: 98.2 F (36.8 C)  SpO2: 99%   Filed  Weights   08/08/23 1259  Weight: 243 lb 12.8 oz (110.6 kg)    GENERAL: well appearing female in NAD  SKIN: skin color, texture, turgor are normal, no rashes or significant lesions EYES: conjunctiva are pink and non-injected, sclera clear OROPHARYNX: no exudate, no erythema; lips, buccal mucosa, and tongue normal  NECK: supple, non-tender LYMPH:  no palpable lymphadenopathy in the cervical, axillary or supraclavicular lymph nodes.  LUNGS: clear to auscultation and percussion with normal breathing effort HEART: regular rate & rhythm and no murmurs and no lower extremity edema ABDOMEN: soft, non-tender, non-distended, normal bowel sounds Musculoskeletal: no cyanosis of digits and no clubbing  PSYCH: alert & oriented x 3, fluent speech NEURO: no focal motor/sensory deficits  LABORATORY DATA:  I have reviewed the data as listed    Latest Ref Rng & Units 08/08/2023    1:31 PM 07/22/2023    8:42 AM 07/18/2023    3:49 PM  CBC  WBC 4.0 - 10.5 K/uL 7.3  9.3  11.3   Hemoglobin 12.0 - 15.0 g/dL 86.5  78.4  69.6   Hematocrit 36.0 - 46.0 % 36.3  36.7  35.1   Platelets 150 - 400 K/uL 527  605  705.0        Latest Ref Rng & Units 08/08/2023    1:31 PM 07/18/2023    3:49 PM 12/17/2022   11:18 AM  CMP  Glucose 70 - 99 mg/dL 97  70  90   BUN 6 - 20 mg/dL 11  7  13    Creatinine 0.44 - 1.00 mg/dL 2.95  2.84  1.32   Sodium 135 -  145 mmol/L 135  136  140   Potassium 3.5 - 5.1 mmol/L 3.7  3.8  4.3   Chloride 98 - 111 mmol/L 98  101  105   CO2 22 - 32 mmol/L 30  25  25    Calcium 8.9 - 10.3 mg/dL 9.4  8.7  9.2   Total Protein 6.5 - 8.1 g/dL 8.1  6.8  6.9   Total Bilirubin 0.3 - 1.2 mg/dL 0.4  0.2  0.3   Alkaline Phos 38 - 126 U/L 118  109  83   AST 15 - 41 U/L 45  30  38   ALT 0 - 44 U/L 41  26  39     ASSESSMENT & PLAN Joyce Conley is a 42 y.o. female who was referred to [place] for iron deficiency. At this time her iron deficiency is suspected to be secondary to heavy menses.   Upon review of her recent labs her last ferritin level was 11.9, iron saturation ratio was 4.7%, TIBC was 494.2. Hgb was 11.1, MCV 78.3, platelets were 605. Normal WBC of 9.3. We discussed these lab in detail. Today we will do additional labs to see if the iron is starting to be effective and to evaluate for other potential causes given her diet.   We discussed the use of oral vs IV iron including risks and benefits. At this time she would like to continue to trial oral iron which I support.   Given her family history and IDA, I have suggested that she consider getting established with a GI specialist. Will also draw an amylase/lipase and CA 19.9.   RTC 2 months APP with labs 7 days prior-Seba Dalkai     Orders Placed This Encounter  Procedures   CBC with Differential (Cancer Center Only)    Standing Status:   Future  Number of Occurrences:   1    Standing Expiration Date:   08/07/2024   CMP (Cancer Center only)    Standing Status:   Future    Number of Occurrences:   1    Standing Expiration Date:   08/07/2024   Ferritin    Standing Status:   Future    Number of Occurrences:   1    Standing Expiration Date:   08/07/2024   Iron and Iron Binding Capacity ([facility] only)    Standing Status:   Future    Number of Occurrences:   1    Standing Expiration Date:   08/07/2024   Folate, Serum    Standing Status:   Future    Number of  Occurrences:   1    Standing Expiration Date:   08/07/2024   Vitamin B12    Standing Status:   Future    Number of Occurrences:   1    Standing Expiration Date:   08/07/2024   Lipase, blood    Standing Status:   Future    Number of Occurrences:   1    Standing Expiration Date:   08/07/2024   Amylase    Standing Status:   Future    Number of Occurrences:   1    Standing Expiration Date:   08/07/2024   CA 19.9    Standing Status:   Future    Number of Occurrences:   1    Standing Expiration Date:   08/07/2024    All questions were answered. The patient knows to call the clinic with any problems, questions or concerns.  I have spent a total of 40 minutes minutes of face-to-face and non-face-to-face time, preparing to see the patient, obtaining and/or reviewing separately obtained history, performing a medically appropriate examination, counseling and educating the patient, ordering medications/tests/procedures, referring and communicating with other health care professionals, documenting clinical information in the electronic health record, independently interpreting results and communicating results to the patient, and care coordination.    Clent Jacks PA-C Department of Hematology/Oncology [facility] at [facility]

## 2023-08-09 ENCOUNTER — Other Ambulatory Visit: Payer: Self-pay | Admitting: Medical Oncology

## 2023-08-09 DIAGNOSIS — R748 Abnormal levels of other serum enzymes: Secondary | ICD-10-CM

## 2023-08-09 DIAGNOSIS — Z800 Family history of malignant neoplasm of digestive organs: Secondary | ICD-10-CM

## 2023-08-09 LAB — CANCER ANTIGEN 19-9: CA 19-9: 7 U/mL (ref 0–35)

## 2023-10-01 ENCOUNTER — Inpatient Hospital Stay: Payer: BLUE CROSS/BLUE SHIELD | Attending: Medical Oncology

## 2023-10-15 ENCOUNTER — Inpatient Hospital Stay: Payer: BLUE CROSS/BLUE SHIELD | Attending: Medical Oncology | Admitting: Medical Oncology

## 2023-11-28 ENCOUNTER — Emergency Department
Admission: EM | Admit: 2023-11-28 | Discharge: 2023-11-28 | Disposition: A | Discharge status: Home / Self Care | Payer: BLUE CROSS/BLUE SHIELD | Attending: Emergency Medicine | Admitting: Emergency Medicine

## 2023-11-28 ENCOUNTER — Other Ambulatory Visit: Payer: Self-pay

## 2023-11-28 ENCOUNTER — Emergency Department: Payer: BLUE CROSS/BLUE SHIELD

## 2023-11-28 ENCOUNTER — Encounter: Payer: Self-pay | Admitting: Emergency Medicine

## 2023-11-28 DIAGNOSIS — J069 Acute upper respiratory infection, unspecified: Secondary | ICD-10-CM | POA: Insufficient documentation

## 2023-11-28 DIAGNOSIS — R059 Cough, unspecified: Secondary | ICD-10-CM | POA: Diagnosis present

## 2023-11-28 DIAGNOSIS — J9801 Acute bronchospasm: Secondary | ICD-10-CM | POA: Diagnosis not present

## 2023-11-28 DIAGNOSIS — B9789 Other viral agents as the cause of diseases classified elsewhere: Secondary | ICD-10-CM | POA: Diagnosis not present

## 2023-11-28 DIAGNOSIS — Z20822 Contact with and (suspected) exposure to covid-19: Secondary | ICD-10-CM | POA: Insufficient documentation

## 2023-11-28 LAB — RESP PANEL BY RT-PCR (RSV, FLU A&B, COVID)  RVPGX2
Influenza A by PCR: NEGATIVE
Influenza B by PCR: NEGATIVE
Resp Syncytial Virus by PCR: NEGATIVE
SARS Coronavirus 2 by RT PCR: NEGATIVE

## 2023-11-28 MED ORDER — ALBUTEROL SULFATE HFA 108 (90 BASE) MCG/ACT IN AERS
2.0000 | INHALATION_SPRAY | RESPIRATORY_TRACT | 0 refills | Status: AC | PRN
  Filled 2023-11-28: qty 6.7, 25d supply, fill #0

## 2023-11-28 MED ORDER — PREDNISONE 20 MG PO TABS
60.0000 mg | ORAL_TABLET | Freq: Once | ORAL | Status: AC
  Administered 2023-11-28: 60 mg via ORAL
  Filled 2023-11-28: qty 3

## 2023-11-28 MED ORDER — IBUPROFEN 800 MG PO TABS
800.0000 mg | ORAL_TABLET | Freq: Once | ORAL | Status: AC
  Administered 2023-11-28: 800 mg via ORAL
  Filled 2023-11-28: qty 1

## 2023-11-28 MED ORDER — IPRATROPIUM-ALBUTEROL 0.5-2.5 (3) MG/3ML IN SOLN
3.0000 mL | Freq: Once | RESPIRATORY_TRACT | Status: AC
  Administered 2023-11-28: 3 mL via RESPIRATORY_TRACT
  Filled 2023-11-28: qty 3

## 2023-11-28 MED ORDER — PREDNISONE 10 MG (21) PO TBPK
ORAL_TABLET | ORAL | 0 refills | Status: AC
  Filled 2023-11-28: qty 21, 6d supply, fill #0

## 2023-11-28 MED ORDER — ONDANSETRON 4 MG PO TBDP
4.0000 mg | ORAL_TABLET | Freq: Four times a day (QID) | ORAL | 0 refills | Status: AC | PRN
  Filled 2023-11-28: qty 20, 5d supply, fill #0

## 2023-11-28 MED ORDER — HYDROCOD POLI-CHLORPHE POLI ER 10-8 MG/5ML PO SUER
5.0000 mL | Freq: Two times a day (BID) | ORAL | 0 refills | Status: AC | PRN
  Filled 2023-11-28: qty 50, 5d supply, fill #0

## 2023-11-28 NOTE — Discharge Instructions (Addendum)
 You may alternate Tylenol 1000 mg every 6 hours as needed for pain, fever and Ibuprofen 800 mg every 6-8 hours as needed for pain, fever.  Please take Ibuprofen with food.  Do not take more than 4000 mg of Tylenol (acetaminophen) in a 24 hour period.   You are being provided a prescription for opiates (also known as narcotics) for pain control.  Opiates can be addictive and should only be used when absolutely necessary for pain control when other alternatives do not work.  We recommend you only use them for the recommended amount of time and only as prescribed.  Please do not take with other sedative medications or alcohol.  Please do not drive, operate machinery, make important decisions while taking opiates.  Please note that these medications can be addictive and have high abuse potential.  Patients can become addicted to narcotics after only taking them for a few days.  Please keep these medications locked away from children, teenagers or any family members with history of substance abuse.  Narcotic pain medicine may also make you constipated.  You may use over-the-counter medications such as MiraLAX, Colace to prevent constipation.  If you become constipated, you may use over-the-counter enemas as needed.  Itching and nausea are also common side effects of narcotic pain medication.  If you develop uncontrolled vomiting or a rash, please stop these medications and seek medical care.

## 2023-11-28 NOTE — ED Triage Notes (Signed)
Patient ambulatory to triage with steady gait, without difficulty or distress noted; pt reports since yesterday having cough, fever & congestion

## 2023-11-28 NOTE — ED Provider Notes (Signed)
[facility] Provider Note    Event Date/Time   First MD Initiated Contact with Patient 11/28/23 [phone]     (approximate)   History   Cough   HPI  Joyce Conley is a 43 y.o. female with history of anxiety and depression who presents to the emergency department with several days of cough, congestion.  Wheezing here but no history of asthma, COPD or tobacco use.  No vomiting or diarrhea.  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   History provided by patient.    Past Medical History:  Diagnosis Date   Anxiety and depression 04/12/2023    Past Surgical History:  Procedure Laterality Date   ANKLE SURGERY Right 2011   CESAREAN SECTION     x2   ORIF ANKLE FRACTURE Right 01/15/2022   Procedure: OPEN REDUCTION INTERNAL FIXATION (ORIF) ANKLE FRACTURE;  Surgeon: Lyndle Herrlich, MD;  Location: [facility] ORS;  Service: Orthopedics;  Laterality: Right;   TUBAL LIGATION      MEDICATIONS:  Prior to Admission medications   Medication Sig Start Date End Date Taking? Authorizing Provider  FLUoxetine (PROZAC) 20 MG capsule Take 1 capsule (20 mg total) by mouth daily. 04/12/23   Clayborne Dana, NP    Physical Exam   Triage Vital Signs: ED Triage Vitals  Encounter Vitals Group     BP 11/28/23 0147 (!) 142/91     Systolic BP Percentile --      Diastolic BP Percentile --      Pulse Rate 11/28/23 0147 (!) 117     Resp 11/28/23 0147 17     Temp 11/28/23 0147 98.5 F (36.9 C)     Temp Source 11/28/23 0147 Oral     SpO2 11/28/23 0147 98 %     Weight 11/28/23 0147 225 lb (102.1 kg)     Height 11/28/23 0147 5\' 8"  (1.727 m)     Head Circumference --      Peak Flow --      Pain Score 11/28/23 0146 0     Pain Loc --      Pain Education --      Exclude from Growth Chart --     Most recent vital signs: Vitals:   11/28/23 0147  BP: (!) 142/91  Pulse: (!) 117  Resp: 17  Temp: 98.5 F (36.9 C)  SpO2: 98%     CONSTITUTIONAL: Alert, responds appropriately to questions. Well-appearing; well-nourished, nontoxic, afebrile HEAD: Normocephalic, atraumatic EYES: Conjunctivae clear, pupils appear equal, sclera nonicteric ENT: normal nose; moist mucous membranes; No pharyngeal erythema or petechiae, no tonsillar hypertrophy or exudate, no uvular deviation, no unilateral swelling in posterior oropharynx, no trismus or drooling, no muffled voice, normal phonation, no stridor, airway patent.  TMs are clear bilaterally without erythema, purulence, bulging, perforation, effusion.  No cerumen impaction or sign of foreign body in the external auditory canal. No inflammation, erythema or drainage from the external auditory canal. No signs of mastoiditis. No pain with manipulation of the pinna bilaterally. NECK: Supple, normal ROM CARD: Regular and tachycardic; S1 and S2 appreciated RESP: Patient has scattered expiratory wheezes on exam.  No rhonchi or rales.  No respiratory distress.  Speaking full sentences.  No hypoxia. ABD/GI: Non-distended; soft, non-tender, no rebound, no guarding, no peritoneal signs BACK: The back appears normal EXT: Normal ROM in all joints; no deformity noted, no edema SKIN: Normal color for age and race; warm;  no rash on exposed skin NEURO: Moves all extremities equally, normal speech PSYCH: The patient's mood and manner are appropriate.   ED Results / Procedures / Treatments   LABS: (all labs ordered are listed, but only abnormal results are displayed) Labs Reviewed  RESP PANEL BY RT-PCR (RSV, FLU A&B, COVID)  [id number]     EKG:     RADIOLOGY: My personal review and interpretation of imaging: Chest x-ray clear.  I have personally reviewed all radiology reports.   DG Chest 2 View Result Date: 11/28/2023 CLINICAL DATA:  Cough, fever and congestion. EXAM: CHEST - 2 VIEW COMPARISON:  None Available. FINDINGS: The cardiac size is normal. There is a moderate-sized hiatal hernia  and slight aortic tortuosity, otherwise unremarkable mediastinum. The lungs are clear. There is no substantial pleural effusion. Thoracic cage is intact. IMPRESSION: No evidence of acute chest disease. Moderate-sized hiatal hernia. Electronically Signed   By: Almira Bar M.D.   On: 11/28/2023 02:05     PROCEDURES:  Critical Care performed: No     Procedures    IMPRESSION / MDM / ASSESSMENT AND PLAN / ED COURSE  I reviewed the triage vital signs and the nursing notes.    Patient here with cough, congestion, wheezing.    DIFFERENTIAL DIAGNOSIS (includes but not limited to):   Viral URI, bronchitis causing bronchospasm, pneumonia, doubt meningitis, sepsis, bacteremia   Patient's presentation is most consistent with acute complicated illness / injury requiring diagnostic workup.   PLAN: Chest x-ray reviewed and interpreted by myself and the radiologist and shows no infiltrate.  She does appear to have some bronchitic changes on my review and is wheezing here.  Will give her a breathing treatment and start her on a steroid taper.  No indication for antibiotics today.  COVID, flu and RSV negative.  Anticipate discharge home with supportive care instructions and with cough suppression.  Will provide work note.   MEDICATIONS GIVEN IN ED: Medications  ipratropium-albuterol (DUONEB) 0.5-2.5 (3) MG/3ML nebulizer solution 3 mL (3 mLs Nebulization Given 11/28/23 0258)  predniSONE (DELTASONE) tablet 60 mg (60 mg Oral Given 11/28/23 0258)  ibuprofen (ADVIL) tablet 800 mg (800 mg Oral Given 11/28/23 0258)     ED COURSE:  At this time, I do not feel there is any life-threatening condition present. I reviewed all nursing notes, vitals, pertinent previous records.  All lab and urine results, EKGs, imaging ordered have been independently reviewed and interpreted by myself.  I reviewed all available radiology reports from any imaging ordered this visit.  Based on my assessment, I feel the patient  is safe to be discharged home without further emergent workup and can continue workup as an outpatient as needed. Discussed all findings, treatment plan as well as usual and customary return precautions.  They verbalize understanding and are comfortable with this plan.  Outpatient follow-up has been provided as needed.  All questions have been answered.    CONSULTS:  none   OUTSIDE RECORDS REVIEWED: Reviewed patient's last internal medicine note in December 2019.       FINAL CLINICAL IMPRESSION(S) / ED DIAGNOSES   Final diagnoses:  Viral URI with cough  Bronchospasm     Rx / DC Orders   ED Discharge Orders          Ordered    chlorpheniramine-HYDROcodone (TUSSIONEX) 10-8 MG/5ML  Every 12 hours PRN        11/28/23 0248    ondansetron (ZOFRAN-ODT) 4 MG disintegrating tablet  Every 6 hours  PRN        11/28/23 0248    albuterol (VENTOLIN HFA) 108 (90 Base) MCG/ACT inhaler  Every 4 hours PRN        11/28/23 0248    predniSONE (STERAPRED UNI-PAK 21 TAB) 10 MG (21) TBPK tablet        11/28/23 0248             Note:  This document was prepared using Dragon voice recognition software and may include unintentional dictation errors.   Justyne Roell, Layla Maw, DO 11/28/23 [phone]
